# Patient Record
Sex: Female | Born: 1980 | Race: White | Hispanic: No | Marital: Single | Smoking: Former smoker
Health system: Southern US, Community
[De-identification: ages and names within clinical notes are randomized; demographics above are authoritative.]

## PROBLEM LIST (undated history)

## (undated) DIAGNOSIS — D649 Anemia, unspecified: Secondary | ICD-10-CM

## (undated) DIAGNOSIS — E785 Hyperlipidemia, unspecified: Secondary | ICD-10-CM

## (undated) DIAGNOSIS — R790 Abnormal level of blood mineral: Secondary | ICD-10-CM

## (undated) DIAGNOSIS — A6920 Lyme disease, unspecified: Secondary | ICD-10-CM

## (undated) DIAGNOSIS — E039 Hypothyroidism, unspecified: Secondary | ICD-10-CM

## (undated) DIAGNOSIS — R079 Chest pain, unspecified: Secondary | ICD-10-CM

## (undated) HISTORY — DX: Abnormal level of blood mineral: R79.0

## (undated) HISTORY — DX: Hyperlipidemia, unspecified: E78.5

## (undated) HISTORY — PX: HERNIA REPAIR: SHX51

## (undated) HISTORY — PX: TUBAL LIGATION: SHX77

## (undated) HISTORY — DX: Chest pain, unspecified: R07.9

## (undated) HISTORY — PX: BUNIONECTOMY: SHX129

## (undated) HISTORY — DX: Anemia, unspecified: D64.9

---

## 2012-05-06 HISTORY — PX: AUGMENTATION MAMMAPLASTY: SUR837

## 2015-09-05 ENCOUNTER — Emergency Department (HOSPITAL_COMMUNITY): Payer: No Typology Code available for payment source

## 2015-09-05 ENCOUNTER — Emergency Department (HOSPITAL_COMMUNITY)
Admission: EM | Admit: 2015-09-05 | Discharge: 2015-09-05 | Disposition: A | Discharge status: Home / Self Care | Payer: No Typology Code available for payment source | Attending: Emergency Medicine | Admitting: Emergency Medicine

## 2015-09-05 ENCOUNTER — Encounter (HOSPITAL_COMMUNITY): Payer: Self-pay | Admitting: *Deleted

## 2015-09-05 DIAGNOSIS — Y998 Other external cause status: Secondary | ICD-10-CM | POA: Diagnosis not present

## 2015-09-05 DIAGNOSIS — S20212A Contusion of left front wall of thorax, initial encounter: Secondary | ICD-10-CM

## 2015-09-05 DIAGNOSIS — Z8619 Personal history of other infectious and parasitic diseases: Secondary | ICD-10-CM | POA: Insufficient documentation

## 2015-09-05 DIAGNOSIS — Y9389 Activity, other specified: Secondary | ICD-10-CM | POA: Insufficient documentation

## 2015-09-05 DIAGNOSIS — S60511A Abrasion of right hand, initial encounter: Secondary | ICD-10-CM | POA: Diagnosis not present

## 2015-09-05 DIAGNOSIS — Z8639 Personal history of other endocrine, nutritional and metabolic disease: Secondary | ICD-10-CM | POA: Insufficient documentation

## 2015-09-05 DIAGNOSIS — Z3202 Encounter for pregnancy test, result negative: Secondary | ICD-10-CM | POA: Insufficient documentation

## 2015-09-05 DIAGNOSIS — M62830 Muscle spasm of back: Secondary | ICD-10-CM | POA: Diagnosis not present

## 2015-09-05 DIAGNOSIS — S199XXA Unspecified injury of neck, initial encounter: Secondary | ICD-10-CM | POA: Diagnosis present

## 2015-09-05 DIAGNOSIS — S139XXA Sprain of joints and ligaments of unspecified parts of neck, initial encounter: Secondary | ICD-10-CM | POA: Insufficient documentation

## 2015-09-05 DIAGNOSIS — S3992XA Unspecified injury of lower back, initial encounter: Secondary | ICD-10-CM | POA: Diagnosis not present

## 2015-09-05 DIAGNOSIS — Y92410 Unspecified street and highway as the place of occurrence of the external cause: Secondary | ICD-10-CM | POA: Insufficient documentation

## 2015-09-05 HISTORY — DX: Hypothyroidism, unspecified: E03.9

## 2015-09-05 HISTORY — DX: Lyme disease, unspecified: A69.20

## 2015-09-05 LAB — I-STAT BETA HCG BLOOD, ED (MC, WL, AP ONLY): I-stat hCG, quantitative: <5 m[IU]/mL (ref <5)

## 2015-09-05 LAB — CBC WITH DIFFERENTIAL/PLATELET
Basophils Absolute: 0 10*3/uL (ref 0.0–0.1)
Basophils Relative: 1 %
EOS ABS: 0 10*3/uL (ref 0.0–0.7)
EOS PCT: 0 %
HCT: 33.2 % — ABNORMAL LOW (ref 36.0–46.0)
Hemoglobin: 10.3 g/dL — ABNORMAL LOW (ref 12.0–15.0)
Lymphocytes Relative: 18 %
Lymphs Abs: 1.3 10*3/uL (ref 0.7–4.0)
MCH: 26.5 pg (ref 26.0–34.0)
MCHC: 31 g/dL (ref 30.0–36.0)
MCV: 85.6 fL (ref 78.0–100.0)
MONOS PCT: 6 %
Monocytes Absolute: 0.4 10*3/uL (ref 0.1–1.0)
Neutro Abs: 5.4 10*3/uL (ref 1.7–7.7)
Neutrophils Relative %: 75 %
PLATELETS: 323 10*3/uL (ref 150–400)
RBC: 3.88 MIL/uL (ref 3.87–5.11)
RDW: 14.4 % (ref 11.5–15.5)
WBC: 7.2 10*3/uL (ref 4.0–10.5)

## 2015-09-05 LAB — COMPREHENSIVE METABOLIC PANEL
ALT: 20 U/L (ref 14–54)
AST: 21 U/L (ref 15–41)
Albumin: 3.8 g/dL (ref 3.5–5.0)
Alkaline Phosphatase: 27 U/L — ABNORMAL LOW (ref 38–126)
Anion gap: 9 (ref 5–15)
BILIRUBIN TOTAL: 0.5 mg/dL (ref 0.3–1.2)
BUN: 17 mg/dL (ref 6–20)
CHLORIDE: 105 mmol/L (ref 101–111)
CO2: 23 mmol/L (ref 22–32)
CREATININE: 0.68 mg/dL (ref 0.44–1.00)
Calcium: 8.9 mg/dL (ref 8.9–10.3)
GFR calc Af Amer: >60 mL/min (ref >60)
GFR calc non Af Amer: >60 mL/min (ref >60)
GLUCOSE: 90 mg/dL (ref 65–99)
POTASSIUM: 4.5 mmol/L (ref 3.5–5.1)
SODIUM: 137 mmol/L (ref 135–145)
TOTAL PROTEIN: 6.4 g/dL — AB (ref 6.5–8.1)

## 2015-09-05 MED ORDER — IOPAMIDOL (ISOVUE-300) INJECTION 61%
INTRAVENOUS | Status: AC
  Administered 2015-09-05: 75 mL
  Filled 2015-09-05: qty 75

## 2015-09-05 MED ORDER — SODIUM CHLORIDE 0.9 % IV BOLUS (SEPSIS)
500.0000 mL | Freq: Once | INTRAVENOUS | Status: AC
  Administered 2015-09-05: 500 mL via INTRAVENOUS

## 2015-09-05 MED ORDER — IBUPROFEN 400 MG PO TABS
600.0000 mg | ORAL_TABLET | Freq: Once | ORAL | Status: AC
  Administered 2015-09-05: 600 mg via ORAL
  Filled 2015-09-05: qty 1
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
## 2015-09-05 NOTE — Progress Notes (Signed)
Orthopedic Tech Progress Note Patient Details:  Brittney Saunders 06/25/1980 [id number]  Ortho Devices Type of Ortho Device: Thumb velcro splint Ortho Device/Splint Interventions: Application   Saul FordyceJennifer C Muaaz Brau 09/05/2015, 2:20 PM

## 2015-09-08 ENCOUNTER — Emergency Department
Admission: EM | Admit: 2015-09-08 | Discharge: 2015-09-08 | Disposition: A | Discharge status: Home / Self Care | Payer: No Typology Code available for payment source | Attending: Emergency Medicine | Admitting: Emergency Medicine

## 2015-09-08 ENCOUNTER — Encounter: Payer: Self-pay | Admitting: Emergency Medicine

## 2015-09-08 ENCOUNTER — Emergency Department: Payer: No Typology Code available for payment source

## 2015-09-08 DIAGNOSIS — S299XXA Unspecified injury of thorax, initial encounter: Secondary | ICD-10-CM | POA: Diagnosis present

## 2015-09-08 DIAGNOSIS — G44319 Acute post-traumatic headache, not intractable: Secondary | ICD-10-CM | POA: Insufficient documentation

## 2015-09-08 DIAGNOSIS — Y9389 Activity, other specified: Secondary | ICD-10-CM | POA: Insufficient documentation

## 2015-09-08 DIAGNOSIS — E039 Hypothyroidism, unspecified: Secondary | ICD-10-CM | POA: Diagnosis not present

## 2015-09-08 DIAGNOSIS — Y92410 Unspecified street and highway as the place of occurrence of the external cause: Secondary | ICD-10-CM | POA: Insufficient documentation

## 2015-09-08 DIAGNOSIS — S20212A Contusion of left front wall of thorax, initial encounter: Secondary | ICD-10-CM

## 2015-09-08 DIAGNOSIS — Y999 Unspecified external cause status: Secondary | ICD-10-CM | POA: Diagnosis not present

## 2015-09-08 DIAGNOSIS — S2020XA Contusion of thorax, unspecified, initial encounter: Secondary | ICD-10-CM | POA: Insufficient documentation

## 2015-09-08 MED ORDER — METHOCARBAMOL 500 MG PO TABS: 500.0000 mg | ORAL_TABLET | Freq: Four times a day (QID) | ORAL | Status: DC

## 2015-09-08 MED ORDER — MELOXICAM 15 MG PO TABS: 15.0000 mg | ORAL_TABLET | Freq: Every day | ORAL | Status: DC

## 2015-09-08 MED ORDER — KETOROLAC TROMETHAMINE 30 MG/ML IJ SOLN
30.0000 mg | Freq: Once | INTRAMUSCULAR | Status: AC
  Administered 2015-09-08: 30 mg via INTRAMUSCULAR
  Filled 2015-09-08: qty 1
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
## 2015-09-08 NOTE — ED Notes (Signed)
AAOx3.  Skin warm and dry.  Moving all extremities equally and strong.  Ambulates with easy and steady gait.  Posture upright and relaxed.  NAD 
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
## 2015-09-08 NOTE — ED Provider Notes (Signed)
EKG: Interpreted by me, Sinus bradycardia with a rate of 49 bpm, low voltage, normal PR interval, normal QRS, normal QT interval. Normal axis.  Medical screening examination/treatment/procedure(s) were performed by non-physician practitioner and as supervising physician I was immediately available for consultation/collaboration.    Emily FilbertJonathan E Gao Mitnick, MD 09/08/15 [phone]
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
## 2016-02-16 ENCOUNTER — Emergency Department (HOSPITAL_COMMUNITY)
Admission: EM | Admit: 2016-02-16 | Discharge: 2016-02-16 | Disposition: A | Discharge status: Home / Self Care | Payer: Medicaid Other | Attending: Emergency Medicine | Admitting: Emergency Medicine

## 2016-02-16 ENCOUNTER — Emergency Department (HOSPITAL_COMMUNITY): Payer: Medicaid Other

## 2016-02-16 ENCOUNTER — Encounter (HOSPITAL_COMMUNITY): Payer: Self-pay

## 2016-02-16 DIAGNOSIS — R079 Chest pain, unspecified: Secondary | ICD-10-CM | POA: Diagnosis not present

## 2016-02-16 DIAGNOSIS — Z5321 Procedure and treatment not carried out due to patient leaving prior to being seen by health care provider: Secondary | ICD-10-CM | POA: Diagnosis not present

## 2016-02-16 LAB — BASIC METABOLIC PANEL
Anion gap: 6 (ref 5–15)
BUN: 8 mg/dL (ref 6–20)
CALCIUM: 9.3 mg/dL (ref 8.9–10.3)
CO2: 28 mmol/L (ref 22–32)
CREATININE: 0.71 mg/dL (ref 0.44–1.00)
Chloride: 105 mmol/L (ref 101–111)
GFR calc Af Amer: >60 mL/min (ref >60)
GFR calc non Af Amer: >60 mL/min (ref >60)
GLUCOSE: 92 mg/dL (ref 65–99)
Potassium: 4.1 mmol/L (ref 3.5–5.1)
Sodium: 139 mmol/L (ref 135–145)

## 2016-02-16 LAB — I-STAT TROPONIN, ED: TROPONIN I, POC: 0 ng/mL (ref 0.00–0.08)

## 2016-02-16 LAB — CBC
HCT: 39.4 % (ref 36.0–46.0)
Hemoglobin: 12.5 g/dL (ref 12.0–15.0)
MCH: 27 pg (ref 26.0–34.0)
MCHC: 31.7 g/dL (ref 30.0–36.0)
MCV: 85.1 fL (ref 78.0–100.0)
PLATELETS: 305 10*3/uL (ref 150–400)
RBC: 4.63 MIL/uL (ref 3.87–5.11)
RDW: 15.9 % — ABNORMAL HIGH (ref 11.5–15.5)
WBC: 4.7 10*3/uL (ref 4.0–10.5)

## 2016-02-16 NOTE — ED Triage Notes (Signed)
Pt. Having chest pain for 3 months.  [facility]ent to [facility] yesterday and they did EKG, CT CHEST,  Echocardiogram labs and discharged to home.  EVerything was negative.  Pt. Continues to have pain.  Pt. Was sent home without any pain medications and was not referred .  Pt. Wants to know what is causing the pain.  Skin is warm and dry, pink.  ECG completed in triage.

## 2016-02-16 NOTE — ED Notes (Signed)
Pt lwbs 

## 2016-02-22 ENCOUNTER — Telehealth: Payer: Self-pay | Admitting: Cardiovascular Disease

## 2016-02-22 NOTE — Telephone Encounter (Signed)
Received records from [facility] with Dr Tresa EndoKelly on 02/23/16.  [facility] for Dr Landry DykeKelly's schedule on 02/23/16. lp

## 2016-02-23 ENCOUNTER — Ambulatory Visit (INDEPENDENT_AMBULATORY_CARE_PROVIDER_SITE_OTHER): Payer: Medicaid Other | Admitting: Cardiovascular Disease

## 2016-02-23 ENCOUNTER — Encounter: Payer: Self-pay | Admitting: Cardiovascular Disease

## 2016-02-23 VITALS — BP 100/64 | HR 68 | Ht 62.0 in | Wt 139.2 lb

## 2016-02-23 DIAGNOSIS — Z9882 Breast implant status: Secondary | ICD-10-CM | POA: Diagnosis not present

## 2016-02-23 DIAGNOSIS — R0789 Other chest pain: Secondary | ICD-10-CM

## 2016-02-23 DIAGNOSIS — Z79899 Other long term (current) drug therapy: Secondary | ICD-10-CM | POA: Diagnosis not present

## 2016-02-23 DIAGNOSIS — Z8619 Personal history of other infectious and parasitic diseases: Secondary | ICD-10-CM

## 2016-02-23 DIAGNOSIS — Z13220 Encounter for screening for lipoid disorders: Secondary | ICD-10-CM

## 2016-02-23 DIAGNOSIS — Z8719 Personal history of other diseases of the digestive system: Secondary | ICD-10-CM

## 2016-02-23 LAB — COMPREHENSIVE METABOLIC PANEL
ALT: 14 U/L (ref 6–29)
AST: 21 U/L (ref 10–30)
Albumin: 4.6 g/dL (ref 3.6–5.1)
Alkaline Phosphatase: 35 U/L (ref 33–115)
BUN: 10 mg/dL (ref 7–25)
CHLORIDE: 101 mmol/L (ref 98–110)
CO2: 28 mmol/L (ref 20–31)
Calcium: 9.6 mg/dL (ref 8.6–10.2)
Creat: 0.77 mg/dL (ref 0.50–1.10)
Glucose, Bld: 73 mg/dL (ref 65–99)
POTASSIUM: 4.9 mmol/L (ref 3.5–5.3)
Sodium: 140 mmol/L (ref 135–146)
TOTAL PROTEIN: 7.7 g/dL (ref 6.1–8.1)
Total Bilirubin: 0.7 mg/dL (ref 0.2–1.2)

## 2016-02-23 LAB — CBC WITH DIFFERENTIAL/PLATELET
BASOS ABS: 0 {cells}/uL (ref 0–200)
Basophils Relative: 0 %
Eosinophils Absolute: 70 cells/uL (ref 15–500)
Eosinophils Relative: 1 %
HEMATOCRIT: 41.1 % (ref 35.0–45.0)
HEMOGLOBIN: 13.4 g/dL (ref 11.7–15.5)
LYMPHS ABS: 1890 {cells}/uL (ref 850–3900)
Lymphocytes Relative: 27 %
MCH: 27.3 pg (ref 27.0–33.0)
MCHC: 32.6 g/dL (ref 32.0–36.0)
MCV: 83.7 fL (ref 80.0–100.0)
MONO ABS: 490 {cells}/uL (ref 200–950)
MPV: 10 fL (ref 7.5–12.5)
Monocytes Relative: 7 %
NEUTROS PCT: 65 %
Neutro Abs: 4550 cells/uL (ref 1500–7800)
Platelets: 336 10*3/uL (ref 140–400)
RBC: 4.91 MIL/uL (ref 3.80–5.10)
RDW: 16.1 % — ABNORMAL HIGH (ref 11.0–15.0)
WBC: 7 10*3/uL (ref 3.8–10.8)

## 2016-02-23 LAB — LIPID PANEL
CHOL/HDL RATIO: 3.5 ratio (ref <=5.0)
CHOLESTEROL: 305 mg/dL — AB (ref 125–200)
HDL: 87 mg/dL (ref >=46)
LDL Cholesterol: 197 mg/dL — ABNORMAL HIGH (ref <130)
Triglycerides: 107 mg/dL (ref <150)
VLDL: 21 mg/dL (ref <30)

## 2016-02-23 LAB — TSH: TSH: 0.6 mIU/L

## 2016-02-23 LAB — C-REACTIVE PROTEIN: CRP: 0.7 mg/L (ref <8.0)

## 2016-02-23 NOTE — Patient Instructions (Addendum)
Your physician recommends that you return for lab work FASTING..  Your physician has requested that you have an echocardiogram. Echocardiography is a painless test that uses sound waves to create images of your heart. It provides your doctor with information about the size and shape of your heart and how well your heart's chambers and valves are working. This procedure takes approximately one hour. There are no restrictions for this procedure. [facility].  Your physician has requested that you have an exercise tolerance test. For further information please visit [email or web address]. Please also follow instruction sheet, as given. This [facility].   Your physician recommends that you schedule a follow-up appointment in: after tests

## 2016-02-24 NOTE — Progress Notes (Signed)
Cardiology Office Note    Date:  02/24/2016   ID:  Brittney Saunders, DOB 1980/07/18, MRN [id number]  PCP:  No PCP Per Patient  Cardiologist:  Shelva Majestic, MD   Chief Complaint  Patient presents with  . New Patient (Initial Visit)    has had some chest pain and tightnessX 3 months, occassional shortness of breath, has edema in hands and feet, no pain or cramping in legs, occassional lightheaded, no dizziness    History of Present Illness:  Brittney Saunders is a 35 y.o. female who is self-referred for cardiology evaluation.  The patient admits to at least 3 month history of chest pain.  Chest pain is typically sharp, pleuritic in nature, and last seconds to minutes, and is not exertionally precipitated.  Apparently, she has had several emergency room evaluations.  Most recently, she went to [facility] on 02/16/2016.  Reportedly the ECG was normal.  She underwent a CT of her chest which reportedly was normal.  In the emergency room reportedly a brief portable echo did not reveal any pericardial effusion and she was discharged home.  Patient is in the office today with her mother.  She is originally from [place].  Approximate 12 years ago.  She was treated with Lyme disease.  She states she received 3 months of antibiotics and it took a year for her Lyme disease to be detected raising the possibility of advanced Lyme disease.  On presentation with possible logic complications.  Review of records from [facility] reveals that she underwent HIV screening and screening for sexually transmitted diseases earlier this year.  This was done after she had changed partners.  According to the patient's mother, previously the patient had been very active and exercised regularly.  Over the last several months.  She cannot do anything without developing this chest pain.  She also has had issues with several abscesses.  The patient states that she cannot sleep well because of pain.  I'm  she takes Xanax to help her sleep.  Of note, 5 years ago for 30th birthday she had silicone breast implants.  Her chest pain occurs every day.  It is not improved with Tylenol.  She denies any fevers, chills or night sweats.  She denies any recent rashes.  She presents for cardiology evaluation.   Past Medical History:  Diagnosis Date  . Hypothyroidism   . Lyme disease     Past Surgical History:  Procedure Laterality Date  . CESAREAN SECTION  2005, 2006, 2008    Current Medications: Outpatient Medications Prior to Visit  Medication Sig Dispense Refill  . levothyroxine (SYNTHROID, LEVOTHROID) 50 MCG tablet Take 50 mcg by mouth daily.  0  . meloxicam (MOBIC) 15 MG tablet Take 1 tablet (15 mg total) by mouth daily. (Patient not taking: Reported on 02/23/2016) 30 tablet 0  . methocarbamol (ROBAXIN) 500 MG tablet Take 1 tablet (500 mg total) by mouth 4 (four) times daily. (Patient not taking: Reported on 02/23/2016) 16 tablet 0   No facility-administered medications prior to visit.      Allergies:   Review of patient's allergies indicates no known allergies.   Social History   Social History  . Marital status: Single    Spouse name: N/A  . Number of children: N/A  . Years of education: N/A   Social History Main Topics  . Smoking status: Never Smoker  . Smokeless tobacco: Never Used  . Alcohol use Yes     Comment:  occassional  . Drug use: No  . Sexual activity: Not Asked   Other Topics Concern  . None   Social History Narrative  . None    Initial social history is notable in that she is divorced for 10 years.  She has 3 children.  She never smoked.  Occasional beer and wine.  He previously used to exercise and do cross fit.  He is not exercise over the past 3-4 months due to her constant pain.  Family History:  The patient's family history includes Aneurysm in her maternal grandmother; Congestive Heart Failure in her maternal grandfather; Heart attack (age of onset: 69) in  her maternal uncle; Other in her maternal aunt.  Her mother is alive at 46 and her father is 71.  She has 2 sisters and one brother do not have significant illness.  ROS General: Negative; No fevers, chills, or night sweats; has it if her fatigue HEENT: Positive for several recent dental abscesses; No changes in vision or hearing, sinus congestion, difficulty swallowing Pulmonary: Negative; No cough, wheezing, shortness of breath, hemoptysis Cardiovascular: See history of present illness GI: Negative; No nausea, vomiting, diarrhea, or abdominal pain GU: Negative; No dysuria, hematuria, or difficulty voiding Musculoskeletal: Negative; no myalgias, joint pain, or weakness Hematologic/Oncology: Negative; no easy bruising, bleeding Endocrine: Negative; no heat/cold intolerance; no diabetes Neuro: Negative; no changes in balance, headaches Skin: Negative; No rashes or skin lesions Psychiatric: She denies being depressed Sleep: Positive for daytime sleepiness.  She states she is sleepy because she cannot sleep at night.  She denies snoring.  She denies any episodes of cataplexy or awareness of narcolepsy. Other comprehensive 14 point system review is negative.  I calculated an Epworth sleepiness scale score today and this endorsed at 24 and is consistent with significant excessive daytime sleepiness   PHYSICAL EXAM:   VS:  BP 100/64 (BP Location: Left Arm, Patient Position: Sitting, Cuff Size: Normal)   Pulse 68   Ht 5' 2"  (1.575 m)   Wt 139 lb 4 oz (63.2 kg)   LMP 02/04/2016   BMI 25.47 kg/m      Wt Readings from Last 3 Encounters:  02/23/16 139 lb 4 oz (63.2 kg)  02/16/16 140 lb (63.5 kg)  09/08/15 140 lb (63.5 kg)    General: Alert, oriented, no distress. Huntched appearance. Skin: normal turgor, no rashes, warm and dry; multiple tattoos HEENT: Normocephalic, atraumatic. Pupils equal round and reactive to light; sclera anicteric; extraocular muscles intact; Fundi Without  hemorrhages or exudates Nose without nasal septal hypertrophy; she has a nose ring Mouth/Parynx benign; Mallinpatti scale Neck: No JVD, no carotid bruits; normal carotid upstroke Lungs: clear to ausculatation and percussion; no wheezing or rales Chest wall: without tenderness to palpitation; bilateral breast implants Heart: PMI not displaced, RRR, s1 s2 normal, 1/6 systolic murmur, no diastolic murmur, no rubs, gallops, thrills, or heaves;  Abdomen: soft, nontender; no hepatosplenomehaly, BS+; abdominal aorta nontender and not dilated by palpation.- Back: no CVA tenderness Pulses 2+ Musculoskeletal: full range of motion, normal strength, no joint deformities Extremities: no clubbing cyanosis or edema, Homan's sign negative  Neurologic: grossly nonfocal; Cranial nerves grossly wnl Psychologic: Flat affect   Studies/Labs Reviewed:   ECG (independently read by me): Normal sinus rhythm at 69 bpm.  Normal intervals.  No ST segment changes.  No ectopy.  Recent Labs: BMP Latest Ref Rng & Units 02/16/2016 09/05/2015  Glucose 65 - 99 mg/dL 92 90  BUN 6 - 20 mg/dL 8 17  Creatinine 0.44 - 1.00 mg/dL 0.71 0.68  Sodium 135 - 145 mmol/L 139 137  Potassium 3.5 - 5.1 mmol/L 4.1 4.5  Chloride 101 - 111 mmol/L 105 105  CO2 22 - 32 mmol/L 28 23  Calcium 8.9 - 10.3 mg/dL 9.3 8.9     Hepatic Function Latest Ref Rng & Units 09/05/2015  Total Protein 6.5 - 8.1 g/dL 6.4(L)  Albumin 3.5 - 5.0 g/dL 3.8  AST 15 - 41 U/L 21  ALT 14 - 54 U/L 20  Alk Phosphatase 38 - 126 U/L 27(L)  Total Bilirubin 0.3 - 1.2 mg/dL 0.5    CBC Latest Ref Rng & Units 02/16/2016 09/05/2015  WBC 4.0 - 10.5 K/uL 4.7 7.2  Hemoglobin 12.0 - 15.0 g/dL 12.5 10.3(L)  Hematocrit 36.0 - 46.0 % 39.4 33.2(L)  Platelets 150 - 400 K/uL 305 323   Lab Results  Component Value Date   MCV 85.1 02/16/2016   MCV 85.6 09/05/2015   No results found for: TSH No results found for: HGBA1C   BNP No results found for: BNP  ProBNP No  results found for: PROBNP   Lipid Panel  No results found for: CHOL, TRIG, HDL, CHOLHDL, VLDL, LDLCALC, LDLDIRECT   RADIOLOGY: Dg Chest 2 View  Result Date: 02/16/2016 CLINICAL DATA:  Midchest pain worsening for 3 months. EXAM: CHEST  2 VIEW COMPARISON:  Chest radiograph 09/08/2015 FINDINGS: Cardiomediastinal contours are normal. No pneumothorax or pleural effusion. No focal airspace consolidation or pulmonary edema. IMPRESSION: Clear lungs. Electronically Signed   By: Ulyses Jarred M.D.   On: 02/16/2016 13:42     Additional studies/ records that were reviewed today include:  I have reviewed ER nurse report from her [facility] emergency room evaluation.  I also have reviewed the encounter at [facility] by Aldona Bar    ASSESSMENT:    1. Other chest pain   2. Atypical chest pain   3. Drug therapy   4. S/P bilateral breast implants   5. Screening for lipid disorders   6. History of Lyme disease   7. History of abscessed tooth      PLAN:  Ms. Jamiaya Bina is a 35 year old female who presents with a three-month history of almost constant, sharp, pleuritic-like chest discomfort.  She has been evaluated in the emergency rooms.  Reportedly, a CT scan was normal.  I will try to get the actual formal interpretation She denies any exertional precipitation of chest pain.  She denies any fevers, chills or night sweats.  There is a history of  remoteLyme disease, but she states she was treated with IV antibiotics for up to 3 months 12 years ago.  This suggests that she had advanced Lyme disease and I am not certain if she developed any neurologic or cardiovascular complications.  I will check with infectious disease to see if there is any value in obtaining blood work concerning titers for Lyme disease antibodies or other serologies.  She is 5 year status post insertion of silicone breast implants; she is unaware of any documentation of silicone leak.  She does not have  chest wall tenderness on exam.  I have recommended that she undergo complete formal 2-D echo Doppler study for further evaluation of potential structural heart disease.  I am also recommending complete set of blood work including a CMET, CBC with differential, ESR, ANA, C-reactive protein, TSH, and lipid panel.  I do not feel her chest pain is ischemic.  However, she is afraid  to do any activity.  I am recommending a routine treadmill test, which hopefully will be normal and can provide encouragement for resumption of exercise.  She may have significant inflammation contributed by recurrent abscessed teeth.  Clinically, she appears depressed.  I will see her back in the office in follow-up of the above studies and further recommendations will be made at that time.   Medication Adjustments/Labs and Tests Ordered: Current medicines are reviewed at length with the patient today.  Concerns regarding medicines are outlined above.  Medication changes, Labs and Tests ordered today are listed in the Patient Instructions below.  Patient Instructions  Your physician recommends that you return for lab work FASTING..  Your physician has requested that you have an echocardiogram. Echocardiography is a painless test that uses sound waves to create images of your heart. It provides your doctor with information about the size and shape of your heart and how well your heart's chambers and valves are working. This procedure takes approximately one hour. There are no restrictions for this procedure. This will be done at the [facility].  Your physician has requested that you have an exercise tolerance test. For further information please visit [email or web address]. Please also follow instruction sheet, as given. This will be done here at the [place].   Your physician recommends that you schedule a follow-up appointment in: after tests        Signed, Shelva Majestic, MD  02/24/2016 12:50 PM      [facility] [address] [place], [place], [place]  [address] Phone: [phone]

## 2016-02-26 LAB — ANA: Anti Nuclear Antibody(ANA): NEGATIVE

## 2016-02-27 ENCOUNTER — Encounter (HOSPITAL_COMMUNITY): Payer: Self-pay | Admitting: Emergency Medicine

## 2016-02-27 ENCOUNTER — Ambulatory Visit (INDEPENDENT_AMBULATORY_CARE_PROVIDER_SITE_OTHER): Payer: Medicaid Other

## 2016-02-27 ENCOUNTER — Telehealth (HOSPITAL_COMMUNITY): Payer: Self-pay

## 2016-02-27 ENCOUNTER — Ambulatory Visit (HOSPITAL_COMMUNITY)
Admission: EM | Admit: 2016-02-27 | Discharge: 2016-02-27 | Disposition: A | Discharge status: Home / Self Care | Payer: Medicaid Other | Attending: Family Medicine | Admitting: Family Medicine

## 2016-02-27 DIAGNOSIS — M25512 Pain in left shoulder: Secondary | ICD-10-CM | POA: Diagnosis not present

## 2016-02-27 DIAGNOSIS — S2232XA Fracture of one rib, left side, initial encounter for closed fracture: Secondary | ICD-10-CM | POA: Diagnosis not present

## 2016-02-27 MED ORDER — HYDROCODONE-ACETAMINOPHEN 7.5-325 MG PO TABS: 1.0000 | ORAL_TABLET | Freq: Four times a day (QID) | ORAL | 0 refills | Status: DC | PRN
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
## 2016-02-27 NOTE — Telephone Encounter (Signed)
Encounter complete. 

## 2016-02-27 NOTE — ED Triage Notes (Signed)
[facility] a complaint of left shoulder, arm and neck pain secondary to falling down 6 stairs yesterday.

## 2016-02-28 ENCOUNTER — Telehealth (HOSPITAL_COMMUNITY): Payer: Self-pay

## 2016-02-28 ENCOUNTER — Ambulatory Visit (HOSPITAL_COMMUNITY)
Admission: RE | Admit: 2016-02-28 | Discharge: 2016-02-28 | Disposition: A | Discharge status: Home / Self Care | Payer: Medicaid Other | Source: Ambulatory Visit | Attending: Cardiovascular Disease | Admitting: Cardiovascular Disease

## 2016-02-28 DIAGNOSIS — R0789 Other chest pain: Secondary | ICD-10-CM | POA: Diagnosis not present

## 2016-02-28 NOTE — Progress Notes (Signed)
  Echocardiogram 2D Echocardiogram has been performed.  Arvil ChacoFoster, Amadu Schlageter 02/28/2016, 10:25 AM

## 2016-02-28 NOTE — Telephone Encounter (Signed)
Encounter complete. 

## 2016-02-29 ENCOUNTER — Ambulatory Visit (HOSPITAL_COMMUNITY)
Admission: RE | Admit: 2016-02-29 | Discharge: 2016-02-29 | Disposition: A | Discharge status: Home / Self Care | Payer: Medicaid Other | Source: Ambulatory Visit | Attending: Cardiovascular Disease | Admitting: Cardiovascular Disease

## 2016-02-29 DIAGNOSIS — R0789 Other chest pain: Secondary | ICD-10-CM

## 2016-02-29 LAB — EXERCISE TOLERANCE TEST
CHL CUP MPHR: 186 {beats}/min
CSEPED: 15 min
CSEPEDS: 1 s
CSEPHR: 102 %
CSEPPHR: 190 {beats}/min
Estimated workload: 17.2 METS
RPE: 17
Rest HR: 80 {beats}/min

## 2016-03-04 ENCOUNTER — Telehealth: Payer: Self-pay | Admitting: Cardiovascular Disease

## 2016-03-04 DIAGNOSIS — Z79899 Other long term (current) drug therapy: Secondary | ICD-10-CM

## 2016-03-04 DIAGNOSIS — E785 Hyperlipidemia, unspecified: Secondary | ICD-10-CM

## 2016-03-04 NOTE — Telephone Encounter (Signed)
Goes directly to VM. Left msg to call. 

## 2016-03-04 NOTE — Telephone Encounter (Signed)
Pt is returning your call

## 2016-03-04 NOTE — Telephone Encounter (Signed)
Returned call to patient. Let her know that Dr Tresa EndoKelly has not reviewed the studies yet. Gave her a preliminary run down and that Dr Tresa EndoKelly will need to review for further information. Will route to Dr Tresa EndoKelly.

## 2016-03-04 NOTE — Telephone Encounter (Signed)
Pt would like her stress and echo test results from last week please.

## 2016-03-05 ENCOUNTER — Ambulatory Visit (HOSPITAL_COMMUNITY)
Admission: EM | Admit: 2016-03-05 | Discharge: 2016-03-05 | Disposition: A | Discharge status: Home / Self Care | Payer: Medicaid Other | Attending: Emergency Medicine | Admitting: Emergency Medicine

## 2016-03-05 ENCOUNTER — Encounter (HOSPITAL_COMMUNITY): Payer: Self-pay | Admitting: Emergency Medicine

## 2016-03-05 ENCOUNTER — Ambulatory Visit (INDEPENDENT_AMBULATORY_CARE_PROVIDER_SITE_OTHER): Payer: Medicaid Other

## 2016-03-05 DIAGNOSIS — S2232XD Fracture of one rib, left side, subsequent encounter for fracture with routine healing: Secondary | ICD-10-CM

## 2016-03-05 DIAGNOSIS — F4323 Adjustment disorder with mixed anxiety and depressed mood: Secondary | ICD-10-CM

## 2016-03-05 DIAGNOSIS — T7491XA Unspecified adult maltreatment, confirmed, initial encounter: Secondary | ICD-10-CM | POA: Diagnosis not present

## 2016-03-05 DIAGNOSIS — M79641 Pain in right hand: Secondary | ICD-10-CM

## 2016-03-05 MED ORDER — TRAMADOL HCL 50 MG PO TABS: 50.0000 mg | ORAL_TABLET | Freq: Four times a day (QID) | ORAL | 0 refills | Status: DC | PRN

## 2016-03-05 MED ORDER — CYCLOBENZAPRINE HCL 5 MG PO TABS: 5.0000 mg | ORAL_TABLET | Freq: Three times a day (TID) | ORAL | 0 refills | Status: DC | PRN

## 2016-03-05 MED ORDER — CITALOPRAM HYDROBROMIDE 10 MG PO TABS: 10.0000 mg | ORAL_TABLET | Freq: Every day | ORAL | 0 refills | Status: DC

## 2016-03-05 MED ORDER — ATORVASTATIN CALCIUM 40 MG PO TABS: 40.0000 mg | ORAL_TABLET | Freq: Every day | ORAL | 3 refills | Status: DC
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
## 2016-03-05 NOTE — Telephone Encounter (Signed)
Called results of echo, GXT and labwork to pt. She verbalized understanding. Rx for atorvastatin 40mg  (1 tablet) by mouth once a day. Lab work orders entered and mailed to patient. She is aware to be fasting and have the labs in 8 weeks (on or around Decemner 27, 2017.)

## 2016-03-05 NOTE — Telephone Encounter (Signed)
[facility]  Labs good x lipids with TC > 300; start atorvastatin 40 mg; Echo and GXT are normal.

## 2016-03-07 ENCOUNTER — Telehealth: Payer: Self-pay | Admitting: *Deleted

## 2016-03-07 ENCOUNTER — Telehealth: Payer: Self-pay | Admitting: Cardiovascular Disease

## 2016-03-07 NOTE — Telephone Encounter (Signed)
Left message to return a call to discuss results and recommendations. 

## 2016-03-07 NOTE — Telephone Encounter (Signed)
-----   Message from Lennette Biharihomas A Kelly, MD sent at 03/05/2016  9:08 AM EDT ----- Labs good x significantly elevated lipids;  Start atorvastatin 40 mg; re-check lipids and LFTs in 8 weeks

## 2016-03-07 NOTE — Telephone Encounter (Signed)
New message  [facility]  Please call back

## 2016-03-08 NOTE — Telephone Encounter (Signed)
[facility]

## 2016-03-13 ENCOUNTER — Telehealth: Payer: Self-pay | Admitting: *Deleted

## 2016-03-13 NOTE — Telephone Encounter (Signed)
Patient notified of lab results and recommendations. Patient voiced verbal understanding. Atorvastatin called to patient's pharmacy. Follow up labs ordered. Patient advised to keep follow up appointment.

## 2016-03-13 NOTE — Telephone Encounter (Signed)
-----   Message from Thomas A Kelly, MD sent at 03/05/2016  9:08 AM EDT ----- Labs good x significantly elevated lipids;  Start atorvastatin 40 mg; re-check lipids and LFTs in 8 weeks 

## 2016-03-22 ENCOUNTER — Encounter: Payer: Self-pay | Admitting: Cardiovascular Disease

## 2016-03-22 ENCOUNTER — Ambulatory Visit (INDEPENDENT_AMBULATORY_CARE_PROVIDER_SITE_OTHER): Payer: Medicaid Other | Admitting: Cardiovascular Disease

## 2016-03-22 VITALS — BP 106/72 | HR 78 | Ht 62.0 in | Wt 142.2 lb

## 2016-03-22 DIAGNOSIS — E7800 Pure hypercholesterolemia, unspecified: Secondary | ICD-10-CM | POA: Diagnosis not present

## 2016-03-22 DIAGNOSIS — R0789 Other chest pain: Secondary | ICD-10-CM | POA: Diagnosis not present

## 2016-03-22 DIAGNOSIS — Z79899 Other long term (current) drug therapy: Secondary | ICD-10-CM

## 2016-03-22 NOTE — Patient Instructions (Signed)
Your physician wants you to follow-up in: 6 months or sooner if needed. You will receive a reminder letter in the mail two months in advance. If you don't receive a letter, please call our office to schedule the follow-up appointment.   If you need a refill on your cardiac medications before your next appointment, please call your pharmacy. 

## 2016-03-24 NOTE — Progress Notes (Signed)
Cardiology Office Note    Date:  03/24/2016   ID:  Brittney Saunders, DOB [birth year], MRN [id number]  PCP:  No PCP Per Patient  Cardiologist:  Shelva Majestic, MD   Chief Complaint  Patient presents with  . Follow-up    test results     History of Present Illness:  Brittney Saunders is a 35 y.o. female who is self-referred for cardiology evaluation.  The patient admits to at least 3 month history of chest pain.  Chest pain is typically sharp, pleuritic in nature, and last seconds to minutes, and is not exertionally precipitated.  Apparently, she has had several emergency room evaluations.  Most recently, she went to [facility] on 02/16/2016.  Reportedly the ECG was normal.  She underwent a CT of her chest which reportedly was normal.  In the emergency room reportedly a brief portable echo did not reveal any pericardial effusion and she was discharged home.  Patient is in the office today with her mother.  She is originally from [place].  Approximate 12 years ago.  She was treated with Lyme disease.  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  Review of records from [facility] reveals that she underwent HIV screening and screening for sexually transmitted diseases earlier this year.  This was done after she had changed partners.  According to the patient's mother, previously the patient had been very active and exercised regularly.  Over the last several months.  She cannot do anything without developing this chest pain.  She also has had issues with several abscesses.  The patient states that she cannot sleep well because of pain.  I'm she takes Xanax to help her sleep.  Of note, 5 years ago for 30th birthday she had silicone breast implants.  Her chest pain was occuring every day.  It is not improved with Tylenol.  She denies any fevers,  chills or night sweats.  She denies any recent rashes.    Since I initially saw her, she underwent an echo Doppler study on 02/28/2016.  Ejection fraction was 60-65%.  There was normal wall motion.  She had normal diastolic parameters.  There is trivial aortic insufficiency.  There was no evidence for any vegetation.  She underwent a routine treadmill test which revealed excellent exercise tolerance.  She will exercise for 15 minutes without chest pain or ECG changes.  No score was 15.  He underwent a comprehensive blood work now evaluation, which essentially was  normal with the exception of marked hyperlipidemia.  Her cholesterol was 305, triglycerides 107, HDL 87, and LDL 197.  CBC, chemistry profile, TSH, C-reactive protein, and ANA were all normal.  Once her lipid studies were obtained, we called in a prescription for atorvastatin.  She is now taking this at 40 mg.  She is tolerating this well.  She presents for reevaluation.   Past Medical History:  Diagnosis Date  . Hypothyroidism   . Lyme disease     Past Surgical History:  Procedure Laterality Date  . CESAREAN SECTION  2005, 2006, 2008  . HERNIA REPAIR      Current Medications: Outpatient Medications Prior to Visit  Medication Sig Dispense Refill  . atorvastatin (LIPITOR) 40 MG tablet Take 1 tablet (40 mg total) by mouth daily. 90 tablet 3  . citalopram (CELEXA) 10 MG tablet Take 1 tablet (  10 mg total) by mouth daily. After 2 weeks increase to 2 tablets daily. (Patient taking differently: Take 20 mg by mouth daily. ) 60 tablet 0  . levothyroxine (SYNTHROID, LEVOTHROID) 50 MCG tablet Take 50 mcg by mouth daily.  0  . cyclobenzaprine (FLEXERIL) 5 MG tablet Take 1 tablet (5 mg total) by mouth 3 (three) times daily as needed for muscle spasms. (Patient not taking: Reported on 03/22/2016) 15 tablet 0  . traMADol (ULTRAM) 50 MG tablet Take 1 tablet (50 mg total) by mouth every 6 (six) hours as needed for severe pain. (Patient not taking:  Reported on 03/22/2016) 15 tablet 0   No facility-administered medications prior to visit.      Allergies:   Prednisone   Social History   Social History  . Marital status: Single    Spouse name: N/A  . Number of children: N/A  . Years of education: N/A   Social History Main Topics  . Smoking status: Never Smoker  . Smokeless tobacco: Never Used  . Alcohol use Yes     Comment: occassional  . Drug use: No  . Sexual activity: Not Asked   Other Topics Concern  . None   Social History Narrative  . None    Additional social history is notable in that she is divorced for 10 years.  She has 3 children.  She never smoked.  Occasional beer and wine.  He previously used to exercise and do cross fit.  He is not exercise over the past 3-4 months due to her constant pain.  Family History:  The patient's family history includes Aneurysm in her maternal grandmother; Congestive Heart Failure in her maternal grandfather; Heart attack (age of onset: 53) in her maternal uncle; Other in her maternal aunt.  Her mother is alive at 6 and her father is 60.  She has 2 sisters and one brother do not have significant illness.  ROS General: Negative; No fevers, chills, or night sweats; has it if her fatigue HEENT: Positive for several recent dental abscesses; No changes in vision or hearing, sinus congestion, difficulty swallowing Pulmonary: Negative; No cough, wheezing, shortness of breath, hemoptysis Cardiovascular: See history of present illness GI: Negative; No nausea, vomiting, diarrhea, or abdominal pain GU: Negative; No dysuria, hematuria, or difficulty voiding Musculoskeletal: Negative; no myalgias, joint pain, or weakness Hematologic/Oncology: Negative; no easy bruising, bleeding Endocrine: Negative; no heat/cold intolerance; no diabetes Neuro: Negative; no changes in balance, headaches Skin: Negative; No rashes or skin lesions Psychiatric: She denies being depressed Sleep: Positive for  daytime sleepiness.  She states she is sleepy because she cannot sleep at night.  She denies snoring.  She denies any episodes of cataplexy or awareness of narcolepsy. Other comprehensive 14 point system review is negative.  I calculated an Epworth sleepiness scale score on her initial evaluation and this endorsed at 24 and is consistent with significant excessive daytime sleepiness   PHYSICAL EXAM:   VS:  BP 106/72 (BP Location: Right Arm, Patient Position: Sitting, Cuff Size: Normal)   Pulse 78   Ht 5' 2"  (1.575 m)   Wt 142 lb 3.2 oz (64.5 kg)   LMP 03/03/2016   SpO2 100%   BMI 26.01 kg/m      Wt Readings from Last 3 Encounters:  03/22/16 142 lb 3.2 oz (64.5 kg)  02/23/16 139 lb 4 oz (63.2 kg)  02/16/16 140 lb (63.5 kg)    General: Alert, oriented, no distress.  Skin: normal turgor, no rashes,  warm and dry; multiple tattoos HEENT: Normocephalic, atraumatic. Pupils equal round and reactive to light; sclera anicteric; extraocular muscles intact; Fundi Without hemorrhages or exudates Nose without nasal septal hypertrophy; she has a nose ring Mouth/Parynx benign; Mallinpatti scale Neck: No JVD, no carotid bruits; normal carotid upstroke Lungs: clear to ausculatation and percussion; no wheezing or rales Chest wall: without tenderness to palpitation; bilateral breast implants Heart: PMI not displaced, RRR, s1 s2 normal, 1/6 systolic murmur, no diastolic murmur, no rubs, gallops, thrills, or heaves;  Abdomen: soft, nontender; no hepatosplenomehaly, BS+; abdominal aorta nontender and not dilated by palpation.- Back: no CVA tenderness Pulses 2+ Musculoskeletal: full range of motion, normal strength, no joint deformities Extremities: no clubbing cyanosis or edema, Homan's sign negative  Neurologic: grossly nonfocal; Cranial nerves grossly wnl Psychologic: Her affect today is normal.  She does not appear depressed and on her initial evaluation.  Her affect was very flat.   Studies/Labs  Reviewed:   ECG (independently read by me): Normal sinus rhythm at 69 bpm.  Normal intervals.  No ST segment changes.  No ectopy.  Recent Labs: BMP Latest Ref Rng & Units 02/23/2016 02/16/2016 09/05/2015  Glucose 65 - 99 mg/dL 73 92 90  BUN 7 - 25 mg/dL 10 8 17   Creatinine 0.50 - 1.10 mg/dL 0.77 0.71 0.68  Sodium 135 - 146 mmol/L 140 139 137  Potassium 3.5 - 5.3 mmol/L 4.9 4.1 4.5  Chloride 98 - 110 mmol/L 101 105 105  CO2 20 - 31 mmol/L 28 28 23   Calcium 8.6 - 10.2 mg/dL 9.6 9.3 8.9     Hepatic Function Latest Ref Rng & Units 02/23/2016 09/05/2015  Total Protein 6.1 - 8.1 g/dL 7.7 6.4(L)  Albumin 3.6 - 5.1 g/dL 4.6 3.8  AST 10 - 30 U/L 21 21  ALT 6 - 29 U/L 14 20  Alk Phosphatase 33 - 115 U/L 35 27(L)  Total Bilirubin 0.2 - 1.2 mg/dL 0.7 0.5    CBC Latest Ref Rng & Units 02/23/2016 02/16/2016 09/05/2015  WBC 3.8 - 10.8 K/uL 7.0 4.7 7.2  Hemoglobin 11.7 - 15.5 g/dL 13.4 12.5 10.3(L)  Hematocrit 35.0 - 45.0 % 41.1 39.4 33.2(L)  Platelets 140 - 400 K/uL 336 305 323   Lab Results  Component Value Date   MCV 83.7 02/23/2016   MCV 85.1 02/16/2016   MCV 85.6 09/05/2015   Lab Results  Component Value Date   TSH 0.60 02/23/2016   No results found for: HGBA1C   BNP No results found for: BNP  ProBNP No results found for: PROBNP   Lipid Panel     Component Value Date/Time   CHOL 305 (H) 02/23/2016 1024   TRIG 107 02/23/2016 1024   HDL 87 02/23/2016 1024   CHOLHDL 3.5 02/23/2016 1024   VLDL 21 02/23/2016 1024   LDLCALC 197 (H) 02/23/2016 1024     RADIOLOGY: Dg Shoulder Left  Result Date: 02/27/2016 CLINICAL DATA:  Left shoulder pain after fall down stairs yesterday. EXAM: LEFT SHOULDER - 2+ VIEW COMPARISON:  None. FINDINGS: Mildly displaced fracture is seen involving the posterior portion of the left third rib. No fracture is seen involving the visualize clavicle, scapula or humerus. No significant degenerative changes noted. IMPRESSION: Mildly displaced left third  rib fracture. No other abnormality seen in the left shoulder. Electronically Signed   By: Marijo Conception, M.D.   On: 02/27/2016 16:29   Dg Hand Complete Right  Result Date: 03/05/2016 CLINICAL DATA:  Injured 2 weeks  ago.  Pain with grasping. EXAM: RIGHT HAND - COMPLETE 3+ VIEW COMPARISON:  09/05/2015 FINDINGS: No evidence of acute or subacute fracture or dislocation. Congenital fusion of the lunate and triquetrum. IMPRESSION: No traumatic finding.  Congenital lunatotriquetral fusion. Electronically Signed   By: Nelson Chimes M.D.   On: 03/05/2016 11:40     Additional studies/ records that were reviewed today include:  I have reviewed ER nurse report from her [facility] emergency room evaluation.  I also have reviewed the encounter at [facility] by Aldona Bar    ASSESSMENT:    No diagnosis found.   PLAN:  Ms. Jaklyn Alen is a 35 year old female who I initially saw 2 months ago when she presented  with a three-month history of almost constant, sharp, pleuritic-like chest discomfort.  She has been evaluated in the emergency rooms.  Reportedly, a CT scan was normal. She denied any exertional precipitation of chest pain, fevers, chills or night sweats.  She had a history of  remoteLyme disease, but she states she was treated with IV antibiotics for up to 3 months 12 years ago.  This suggests that she had advanced Lyme disease and I am not certain if she developed any neurologic or cardiovascular complications.  I reviewed her studies that she had had since her initial evaluation.  Her echo Doppler study was entirely normal.  There was no evidence for pericardial effusion.  She had normal systolic and diastolic parameters.  Her routine treadmill test demonstrated excellent exercise tolerance without chest pain, development or ECG changes.  Her laboratory was all normal with the exception of marked hyperlipidemia.  She now is on atorvastatin 40 mg and has not yet had follow-up  laboratory.  This will be obtained after 3 months of treatment.  Discussed with her proper diet and she has significant leak changed her dietary intake since becoming aware of her marked hyperlipidemia.  I reassured her that her chest pain symptomatology did not appear to be ischemic etiology.  She does not have any signs of infection.  Her no markers of inflammation.  Specifically, her C-reactive protein was 0.7 which is excellent.  I discussed the importance of continued exercise.  Her blood pressure today remains stable.  I will see her in 6 months for reevaluation.   Medication Adjustments/Labs and Tests Ordered: Current medicines are reviewed at length with the patient today.  Concerns regarding medicines are outlined above.  Medication changes, Labs and Tests ordered today are listed in the Patient Instructions below.  Patient Instructions  Your physician wants you to follow-up in: 6 months or sooner if needed. You will receive a reminder letter in the mail two months in advance. If you don't receive a letter, please call our office to schedule the follow-up appointment.  If you need a refill on your cardiac medications before your next appointment, please call your pharmacy.     Signed, Shelva Majestic, MD  03/24/2016 8:02 AM    [facility] [address], [place], [place], [place]  [address] Phone: [phone]

## 2016-05-27 ENCOUNTER — Encounter (HOSPITAL_COMMUNITY): Payer: Self-pay | Admitting: Emergency Medicine

## 2016-05-27 ENCOUNTER — Ambulatory Visit (HOSPITAL_COMMUNITY)
Admission: EM | Admit: 2016-05-27 | Discharge: 2016-05-27 | Disposition: A | Discharge status: Nursing Home (Includes ICF) | Payer: Medicaid Other | Attending: Family Medicine | Admitting: Family Medicine

## 2016-05-27 ENCOUNTER — Emergency Department (HOSPITAL_COMMUNITY): Payer: Medicaid Other

## 2016-05-27 ENCOUNTER — Emergency Department (HOSPITAL_COMMUNITY)
Admission: EM | Admit: 2016-05-27 | Discharge: 2016-05-27 | Disposition: A | Discharge status: Home / Self Care | Payer: Medicaid Other | Attending: Emergency Medicine | Admitting: Emergency Medicine

## 2016-05-27 DIAGNOSIS — X58XXXA Exposure to other specified factors, initial encounter: Secondary | ICD-10-CM | POA: Insufficient documentation

## 2016-05-27 DIAGNOSIS — S2232XA Fracture of one rib, left side, initial encounter for closed fracture: Secondary | ICD-10-CM | POA: Diagnosis not present

## 2016-05-27 DIAGNOSIS — Y929 Unspecified place or not applicable: Secondary | ICD-10-CM | POA: Diagnosis not present

## 2016-05-27 DIAGNOSIS — Y939 Activity, unspecified: Secondary | ICD-10-CM | POA: Insufficient documentation

## 2016-05-27 DIAGNOSIS — Z79899 Other long term (current) drug therapy: Secondary | ICD-10-CM | POA: Diagnosis not present

## 2016-05-27 DIAGNOSIS — E039 Hypothyroidism, unspecified: Secondary | ICD-10-CM | POA: Diagnosis not present

## 2016-05-27 DIAGNOSIS — Y999 Unspecified external cause status: Secondary | ICD-10-CM | POA: Insufficient documentation

## 2016-05-27 DIAGNOSIS — S299XXA Unspecified injury of thorax, initial encounter: Secondary | ICD-10-CM | POA: Diagnosis present

## 2016-05-27 LAB — I-STAT TROPONIN, ED
TROPONIN I, POC: 0 ng/mL (ref 0.00–0.08)
Troponin i, poc: 0 ng/mL (ref 0.00–0.08)

## 2016-05-27 LAB — CBC
HEMATOCRIT: 36.8 % (ref 36.0–46.0)
HEMOGLOBIN: 11.7 g/dL — AB (ref 12.0–15.0)
MCH: 27.3 pg (ref 26.0–34.0)
MCHC: 31.8 g/dL (ref 30.0–36.0)
MCV: 85.8 fL (ref 78.0–100.0)
Platelets: 302 10*3/uL (ref 150–400)
RBC: 4.29 MIL/uL (ref 3.87–5.11)
RDW: 15.8 % — ABNORMAL HIGH (ref 11.5–15.5)
WBC: 5.1 10*3/uL (ref 4.0–10.5)

## 2016-05-27 LAB — BASIC METABOLIC PANEL
ANION GAP: 8 (ref 5–15)
BUN: 9 mg/dL (ref 6–20)
CALCIUM: 9.2 mg/dL (ref 8.9–10.3)
CO2: 25 mmol/L (ref 22–32)
Chloride: 107 mmol/L (ref 101–111)
Creatinine, Ser: 0.76 mg/dL (ref 0.44–1.00)
Glucose, Bld: 103 mg/dL — ABNORMAL HIGH (ref 65–99)
POTASSIUM: 4.1 mmol/L (ref 3.5–5.1)
SODIUM: 140 mmol/L (ref 135–145)

## 2016-05-27 LAB — I-STAT BETA HCG BLOOD, ED (MC, WL, AP ONLY)

## 2016-05-27 LAB — D-DIMER, QUANTITATIVE (NOT AT ARMC): D DIMER QUANT: 0.63 ug{FEU}/mL — AB (ref 0.00–0.50)

## 2016-05-27 MED ORDER — IOPAMIDOL (ISOVUE-370) INJECTION 76%
INTRAVENOUS | Status: AC
  Administered 2016-05-27: 100 mL
  Filled 2016-05-27: qty 100

## 2016-05-27 MED ORDER — NAPROXEN 500 MG PO TABS: 500.0000 mg | ORAL_TABLET | Freq: Two times a day (BID) | ORAL | 0 refills | Status: DC

## 2016-05-27 NOTE — ED Triage Notes (Signed)
Pt sts palpitations and SOB x 5 days

## 2016-05-27 NOTE — ED Triage Notes (Signed)
[facility] a [facility] of SOB and heart palpitations x 5 days.

## 2016-05-27 NOTE — ED Notes (Signed)
Patient transported to CT 

## 2016-05-27 NOTE — Discharge Instructions (Signed)
Please read and follow all provided instructions.  Your diagnoses today include:  1. Closed fracture of one rib of left side, initial encounter    Tests performed today include: Vital signs. See below for your results today.   Medications prescribed:  Take as prescribed   Home care instructions:  Follow any educational materials contained in this packet.  Follow-up instructions: Please follow-up with your primary care provider for further evaluation of symptoms and treatment   Return instructions:  Please return to the Emergency Department if you do not get better, if you get worse, or new symptoms OR  - Fever (temperature greater than 101.28F)  - Bleeding that does not stop with holding pressure to the area    -Severe pain (please note that you may be more sore the day after your accident)  - Chest Pain  - Difficulty breathing  - Severe nausea or vomiting  - Inability to tolerate food and liquids  - Passing out  - Skin becoming red around your wounds  - Change in mental status (confusion or lethargy)  - New numbness or weakness    Please return if you have any other emergent concerns.  Additional Information:  Your vital signs today were: BP 104/60 (BP Location: Right Arm)    Pulse (!) 48    Temp 98 F (36.7 C) (Oral)    Resp 19    Ht 5\' 2"  (1.575 m)    Wt 67.6 kg    LMP 05/27/2016    SpO2 100%    BMI 27.25 kg/m  If your blood pressure (BP) was elevated above 135/85 this visit, please have this repeated by your doctor within one month. ---------------

## 2016-05-27 NOTE — ED Notes (Signed)
Cleotis LemaW. Oxford-NP reviewed EKG and triage notes and [facility]. [facility] was [facility]

## 2016-05-27 NOTE — ED Provider Notes (Signed)
[facility]-EMERGENCY DEPT Provider Note   CSN: [id number] Arrival date & time: 05/27/16  1125     History   Chief Complaint Chief Complaint  Patient presents with  . Shortness of Breath  . Palpitations    HPI Brittney Saunders is a 36 y.o. female.  HPI  36 y.o. female, presents to the Emergency Department today complaining of SOB and heart palpitations x 5 days. Notes mild chest discomfort with palpations on and off for the past 5 days with no exacerbating factors. Notes no N/V/D. No diaphoresis. No hx ACS. No hx DVT/PE. No recent travel. No recent surgeries. Notes hx same in past and seen by Cardiologist and told it was likely aortic regurgitation. Minimal relief with OTC remedies. Notes pain currently 4/10. No other symptoms noted.    Past Medical History:  Diagnosis Date  . Hypothyroidism   . Lyme disease     There are no active problems to display for this patient.   Past Surgical History:  Procedure Laterality Date  . CESAREAN SECTION  2005, 2006, 2008  . HERNIA REPAIR      OB History    No data available       Home Medications    Prior to Admission medications   Medication Sig Start Date End Date Taking? Authorizing Provider  citalopram (CELEXA) 10 MG tablet Take 1 tablet (10 mg total) by mouth daily. After 2 weeks increase to 2 tablets daily. Patient taking differently: Take 20 mg by mouth daily.  03/05/16   Charm Rings, MD  levothyroxine (SYNTHROID, LEVOTHROID) 50 MCG tablet Take 50 mcg by mouth daily. 02/05/16   Historical Provider, MD    Family History Family History  Problem Relation Age of Onset  . Aneurysm Maternal Grandmother   . Congestive Heart Failure Maternal Grandfather   . Heart attack Maternal Uncle 35  . Other Maternal Aunt     infection of the heart    Social History Social History  Substance Use Topics  . Smoking status: Never Smoker  . Smokeless tobacco: Never Used  . Alcohol use Yes     Comment: occassional     Allergies     Prednisone   Review of Systems Review of Systems ROS reviewed and all are negative for acute change except as noted in the HPI.  Physical Exam Updated Vital Signs BP 127/77 (BP Location: Left Arm)   Pulse (!) 53   Temp 98 F (36.7 C) (Oral)   Resp 16   Ht 5\' 2"  (1.575 m)   Wt 67.6 kg   LMP 05/27/2016   SpO2 100%   BMI 27.25 kg/m   Physical Exam  Constitutional: She is oriented to person, place, and time. Vital signs are normal. She appears well-developed and well-nourished. No distress.  HENT:  Head: Normocephalic and atraumatic.  Right Ear: Hearing, tympanic membrane, external ear and ear canal normal.  Left Ear: Hearing, tympanic membrane, external ear and ear canal normal.  Nose: Nose normal.  Mouth/Throat: Uvula is midline, oropharynx is clear and moist and mucous membranes are normal. No trismus in the jaw. No oropharyngeal exudate, posterior oropharyngeal erythema or tonsillar abscesses.  Eyes: Conjunctivae and EOM are normal. Pupils are equal, round, and reactive to light.  Neck: Normal range of motion. Neck supple. No tracheal deviation present.  Cardiovascular: Normal rate, regular rhythm, S1 normal, S2 normal, normal heart sounds, intact distal pulses and normal pulses.   Pulmonary/Chest: Effort normal and breath sounds normal. No respiratory distress. She  has no decreased breath sounds. She has no wheezes. She has no rhonchi. She has no rales.  Abdominal: Normal appearance and bowel sounds are normal. There is no tenderness.  Musculoskeletal: Normal range of motion.  Neurological: She is alert and oriented to person, place, and time.  Skin: Skin is warm and dry.  Psychiatric: She has a normal mood and affect. Her speech is normal and behavior is normal. Thought content normal.  Nursing note and vitals reviewed.  ED Treatments / Results  Labs (all labs ordered are listed, but only abnormal results are displayed) Labs Reviewed  BASIC METABOLIC PANEL - Abnormal;  Notable for the following:       Result Value   Glucose, Bld 103 (*)    All other components within normal limits  CBC - Abnormal; Notable for the following:    Hemoglobin 11.7 (*)    RDW 15.8 (*)    All other components within normal limits  D-DIMER, QUANTITATIVE (NOT AT [facility]) - Abnormal; Notable for the following:    D-Dimer, Quant 0.63 (*)    All other components within normal limits  I-STAT TROPOININ, ED  I-STAT BETA HCG BLOOD, ED ([facility], [facility], [facility] ONLY)  I-STAT TROPOININ, ED    EKG  EKG Interpretation None       Radiology Dg Chest 2 View  Result Date: 05/27/2016 CLINICAL DATA:  Tachycardia, chest pain, and shortness of breath for the past 5 days. Sensation of a lump in the neck. EXAM: CHEST  2 VIEW COMPARISON:  Chest x-ray of February 16, 2016 FINDINGS: The lungs are adequately inflated. Subtle density in the left upper lobe is unchanged since October 2017 but is new since May 2017. The heart and pulmonary vascularity are normal. The mediastinum is normal in width. There is no pleural effusion. The bony thorax is unremarkable. Left upper lobe. IMPRESSION: Subtle density in the left upper lobe new since May 2017. This may reflect pneumonia but is nonspecific. Followup PA and lateral chest X-ray is recommended in 3-4 weeks following trial of antibiotic therapy to ensure resolution. Electronically Signed   By: David  SwazilandJordan M.D.   On: 05/27/2016 13:00   Ct Angio Chest Pe W And/or Wo Contrast  Result Date: 05/27/2016 CLINICAL DATA:  Chest pain x5 days with elevated D-dimer. EXAM: CT ANGIOGRAPHY CHEST WITH CONTRAST TECHNIQUE: Multidetector CT imaging of the chest was performed using the standard protocol during bolus administration of intravenous contrast. Multiplanar CT image reconstructions and MIPs were obtained to evaluate the vascular anatomy. CONTRAST:  80 cc of Isovue 370 IV COMPARISON:  CXR 05/27/2016, chest CT 09/05/2015 FINDINGS: Cardiovascular: The study is of quality for the  evaluation of pulmonary embolism. There are no filling defects in the central, lobar, segmental or subsegmental pulmonary artery branches to suggest acute pulmonary embolism. Great vessels are normal in course and caliber. Normal heart size. No significant pericardial fluid/thickening. Mediastinum/Nodes: No discrete thyroid nodules. Unremarkable esophagus. No pathologically enlarged axillary, mediastinal or hilar lymph nodes. Lungs/Pleura: No pneumothorax. No pulmonary consolidation nor effusion. Upper abdomen: Unremarkable.   Bilateral breast implants are noted. Musculoskeletal: Left anterior second rib fracture with partial osseous union suggesting a subacute injury. This is believed to account for the density seen on recent chest radiograph over the left upper lobe. Review of the MIP images confirms the above findings. IMPRESSION: 1. No acute pulmonary embolus. 2. No pulmonary consolidation or pneumothorax.  No effusion. 3. Subacute left anterior second rib fracture corresponding with the density seen on same  day chest radiograph over the left upper lobe. Electronically Signed   By: Tollie Eth M.D.   On: 05/27/2016 19:22    Procedures Procedures (including critical care time)  Medications Ordered in ED Medications - No data to display   Initial Impression / Assessment and Plan / ED Course  I have reviewed the triage vital signs and the nursing notes.  Pertinent labs & imaging results that were available during my care of the patient were reviewed by me and considered in my medical decision making (see chart for details).     Final Clinical Impressions(s) / ED Diagnoses  {I have reviewed and evaluated the relevant laboratory values. {I have reviewed and evaluated the relevant imaging studies. {I have interpreted the relevant EKG. {I have reviewed the relevant previous healthcare records.  {I obtained HPI from historian.   ED Course:  Assessment: Pt is a 35yF presents with CP x 5 days with  associated SOB. Hx same. Risk Factors HLD, FH. Patient is to be discharged with recommendation to follow up with PCP in regards to today's hospital visit. Chest pain is not likely of cardiac or pulmonary etiology d/t presentation, perc negative, VSS, no tracheal deviation, no JVD or new murmur, RRR, breath sounds equal bilaterally, EKG without acute abnormalities, negative troponinx2, and CXR shows left upper lobe density in the absence of URI symptoms obtained ad D dimer, which was elevated at 0.63. CT Angio Chest negative for PE. Showed subacute rib fracture. Pt has been advised start a NSAIDs. Return to the ED is CP becomes exertional, associated with diaphoresis or nausea, radiates to left jaw/arm, worsens or becomes concerning in any way. Pt appears reliable for follow up and is agreeable to discharge. Patient is in no acute distress. Vital Signs are stable. Patient is able to ambulate. Patient able to tolerate PO.   Disposition/Plan:  DC Home Additional Verbal discharge instructions given and discussed with patient.  Pt Instructed to f/u with PCP in the next week for evaluation and treatment of symptoms. Return precautions given Pt acknowledges and agrees with plan  Supervising Physician Rolland Porter, MD  Final diagnoses:  Closed fracture of one rib of left side, initial encounter    New Prescriptions New Prescriptions   No medications on file     Audry Pili, PA-C 05/27/16 1948    Rolland Porter, MD 06/06/16 [phone]

## 2016-06-02 NOTE — Progress Notes (Signed)
Cardiology Office Note    Date:  06/03/2016   ID:  Brittney Saunders, DOB 08/24/1980, MRN [id number]  PCP:  No PCP Per Patient  Cardiologist: Dr. Tresa EndoKelly  Chief Complaint  Patient presents with  . Palpitations    History of Present Illness:    Brittney Saunders is a 36 y.o. female with past medical history of Hypothyroidism and Lyme disease who presents to the office today for ER follow-up.   Was initially seen by Dr. Tresa EndoKelly in 02/2016 for evaluation of chest pain. Reported having chest discomfort occurring daily with minimal activity. Had chest wall tenderness on examination. An echocardiogram was performed which showed normal LV function of 60-65% with no wall motion abnormalities. Underwent ETT in 02/2016 showing great exercise tolerance with no ischemic changes noted on EKG. Was seen by Dr. Tresa EndoKelly in 03/2016 and doing well at that time, encouraged to follow-up in 6 months.  Was recently seen in the ER on 05/27/2016 for shortness of breath and palpitations for the past 5 days. CBC showed WBC 5.1, Hgb 11.7, platelets 302. Creatinine 0.76. I-stat and delta troponin negative. EKG showed NSR, HR 73, with no acute ST or T-wave changes.  D-dimer mildly elevated with CTA showing no evidence of acute PE, pulmonary consolidation, or pneumothorax. A subacute left anterior second rib fracture was noted. Was informed to start NSAIDS and follow-up with PCP within one week.   In talking with the patient today, she reports having continual palpitations occurring almost daily since her recent ER visit. Says they can occur while just sitting and have occasionally woken her up at night. They typically last for 2-5 minutes and spontaneously resolved. Reports associated dyspnea. Denies any lightheadedness, dizziness, or presyncope.  Reports having not taken her Synthroid for the past 6-8 weeks, due to her PCP no longer accepting Medicaid. Palpitations were present before this but have increased in frequency and  duration.  She denies any excessive caffeine use, only consuming one cup of coffee per day. No alcohol or tobacco use.    Past Medical History:  Diagnosis Date  . Chest pain    a. ETT in 02/2016 showing no ischemic changes, echo with preserved EF of 60-65% with no WMA.  . Hypothyroidism   . Lyme disease     Past Surgical History:  Procedure Laterality Date  . CESAREAN SECTION  2005, 2006, 2008  . HERNIA REPAIR      Current Medications: Outpatient Medications Prior to Visit  Medication Sig Dispense Refill  . citalopram (CELEXA) 10 MG tablet Take 1 tablet (10 mg total) by mouth daily. After 2 weeks increase to 2 tablets daily. (Patient not taking: Reported on 06/03/2016) 60 tablet 0  . levothyroxine (SYNTHROID, LEVOTHROID) 50 MCG tablet Take 50 mcg by mouth daily.  0  . naproxen (NAPROSYN) 500 MG tablet Take 1 tablet (500 mg total) by mouth 2 (two) times daily. 30 tablet 0   No facility-administered medications prior to visit.      Allergies:   Prednisone   Social History   Social History  . Marital status: Single    Spouse name: N/A  . Number of children: N/A  . Years of education: N/A   Social History Main Topics  . Smoking status: Never Smoker  . Smokeless tobacco: Never Used  . Alcohol use Yes     Comment: occassional  . Drug use: No  . Sexual activity: Not Asked   Other Topics Concern  . None   Social History Narrative  .  None     Family History:  The patient's family history includes Aneurysm in her maternal grandmother; Congestive Heart Failure in her maternal grandfather; Heart attack (age of onset: 59) in her maternal uncle; Other in her maternal aunt.   Review of Systems:   Please see the history of present illness.     General:  No chills, fever, night sweats or weight changes.  Cardiovascular:  No chest pain, dyspnea on exertion, edema, orthopnea, paroxysmal nocturnal dyspnea. Positive for palpitations.  Dermatological: No rash,  lesions/masses Respiratory: No cough, Positive for dyspnea Urologic: No hematuria, dysuria Abdominal:   No nausea, vomiting, diarrhea, bright red blood per rectum, melena, or hematemesis Neurologic:  No visual changes, wkns, changes in mental status. All other systems reviewed and are otherwise negative except as noted above.   Physical Exam:    VS:  BP 102/64   Pulse 60   Ht 5\' 2"  (1.575 m)   Wt 148 lb 6.4 oz (67.3 kg)   LMP 05/27/2016   BMI 27.14 kg/m    General: Well developed, well nourished Caucasian female appearing in no acute distress. Head: Normocephalic, atraumatic, sclera non-icteric, no xanthomas, nares are without discharge.  Neck: No carotid bruits. JVD not elevated.  Lungs: Respirations regular and unlabored, without wheezes or rales.  Heart: Regular rate and rhythm. No S3 or S4.  No murmur, no rubs, or gallops appreciated. Abdomen: Soft, non-tender, non-distended with normoactive bowel sounds. No hepatomegaly. No rebound/guarding. No obvious abdominal masses. Msk:  Strength and tone appear normal for age. No joint deformities or effusions. Extremities: No clubbing or cyanosis. No edema.  Distal pedal pulses are 2+ bilaterally. Neuro: Alert and oriented X 3. Moves all extremities spontaneously. No focal deficits noted. Psych:  Responds to questions appropriately with a normal affect. Skin: No rashes or lesions noted  Wt Readings from Last 3 Encounters:  06/03/16 148 lb 6.4 oz (67.3 kg)  05/27/16 149 lb (67.6 kg)  03/22/16 142 lb 3.2 oz (64.5 kg)     Studies/Labs Reviewed:   EKG:  EKG is not ordered today.    Recent Labs: 02/23/2016: ALT 14; TSH 0.60 05/27/2016: BUN 9; Creatinine, Ser 0.76; Hemoglobin 11.7; Platelets 302; Potassium 4.1; Sodium 140   Lipid Panel    Component Value Date/Time   CHOL 305 (H) 02/23/2016 1024   TRIG 107 02/23/2016 1024   HDL 87 02/23/2016 1024   CHOLHDL 3.5 02/23/2016 1024   VLDL 21 02/23/2016 1024   LDLCALC 197 (H)  02/23/2016 1024    Additional studies/ records that were reviewed today include:   Echocardiogram: 02/2016 Study Conclusions  - Left ventricle: The cavity size was normal. Systolic function was   normal. The estimated ejection fraction was in the range of 60%   to 65%. Wall motion was normal; there were no regional wall   motion abnormalities. - Aortic valve: There was trivial regurgitation.  Impressions:  - There was no evidence of a vegetation.  ETT: 02/2016  Blood pressure demonstrated a normal response to exercise.  There was no ST segment deviation noted during stress.   ETT with excellent exercise tolerance (15:01); no chest pain; normal BP response (SBP reported at 98 in stage 5 felt to be inaccurate); no ST changes; negative adequate ETT; Duke treadmill score 15.  Assessment:    1. Palpitations   2. Hypothyroidism, unspecified type   3. Atypical chest pain      Plan:   In order of problems listed above:  1.  Palpitations - reports worsening palpitations for the past month, now occurring more frequently (daily). Typically last for 2-5 minutes and spontaneously resolve. Reports associated dyspnea but denies any lightheadedness, dizziness, or presyncope. - recent CBC/BMET from 1/22 showed WBC 5.1, Hgb 11.7, platelets 302. K+ 4.1. Denies any excessive caffeine use, only consuming one cup of coffee per day. No alcohol or tobacco use. - will check TSH and Free T4 with plans to resume Synthroid as below.  - order 72 Hr Holter monitor to assess for any arrhythmias.  2. Hypothyroidism - previously on Synthroid 50 mcg daily but has been without this for 6-8 weeks due to her PCP no longer accepting Medicaid.  - will check TSH and Free T4 today. Restart Synthroid. Will provide Rx today as this could be contributing to her symptoms. She is establishing with a new PCP in 2 weeks. Is aware future refills will need to come from there.  3. Atypical Chest Pain - echo in  02/2016 showed normal LV function of 60-65% with no wall motion abnormalities. ETT with no ischemic changes noted on EKG. - no recurrent symptoms reported.    Medication Adjustments/Labs and Tests Ordered: Current medicines are reviewed at length with the patient today.  Concerns regarding medicines are outlined above.  Medication changes, Labs and Tests ordered today are listed in the Patient Instructions below. Patient Instructions  Medication Instructions:  Your physician recommends that you continue on your current medications as directed. Please refer to the Current Medication list given to you today.  Labwork: Your physician recommends that you have lab work TODAY (TSH, T4 free).  Testing/Procedures: Your physician has recommended that you wear a 72 HOUR holter monitor. Holter monitors are medical devices that record the heart's electrical activity. Doctors most often use these monitors to diagnose arrhythmias. Arrhythmias are problems with the speed or rhythm of the heartbeat. The monitor is a small, portable device. You can wear one while you do your normal daily activities. This is usually used to diagnose what is causing palpitations/syncope (passing out).   Follow-Up: Your physician wants you to follow-up in: 6 MONTHS WITH DR. Tresa Endo UNLESS MONITOR RESULTS ARE ABNORMAL. You will receive a reminder letter in the mail two months in advance. If you don't receive a letter, please call our office to schedule the follow-up appointment.   If you need a refill on your cardiac medications before your next appointment, please call your pharmacy    Signed, Ellsworth Lennox, [place]  06/03/2016 6:01 PM    [facility] [address], [address] [place], [place]  [address] Phone: [phone]; Fax: [phone]  [address] [place], [place] [address] Phone: [phone]

## 2016-06-03 ENCOUNTER — Ambulatory Visit (INDEPENDENT_AMBULATORY_CARE_PROVIDER_SITE_OTHER): Payer: Medicaid Other | Admitting: Student

## 2016-06-03 ENCOUNTER — Other Ambulatory Visit: Payer: Self-pay | Admitting: Student

## 2016-06-03 ENCOUNTER — Encounter: Payer: Self-pay | Admitting: Student

## 2016-06-03 VITALS — BP 102/64 | HR 60 | Ht 62.0 in | Wt 148.4 lb

## 2016-06-03 DIAGNOSIS — R0789 Other chest pain: Secondary | ICD-10-CM

## 2016-06-03 DIAGNOSIS — R002 Palpitations: Secondary | ICD-10-CM | POA: Diagnosis not present

## 2016-06-03 DIAGNOSIS — E039 Hypothyroidism, unspecified: Secondary | ICD-10-CM | POA: Diagnosis not present

## 2016-06-03 DIAGNOSIS — R0609 Other forms of dyspnea: Secondary | ICD-10-CM

## 2016-06-03 LAB — T4, FREE: Free T4: 1 ng/dL (ref 0.8–1.8)

## 2016-06-03 LAB — TSH: TSH: 1.16 mIU/L

## 2016-06-03 MED ORDER — LEVOTHYROXINE SODIUM 50 MCG PO TABS: 50.0000 ug | ORAL_TABLET | Freq: Every day | ORAL | 2 refills | Status: DC

## 2016-06-03 NOTE — Patient Instructions (Signed)
Medication Instructions:  Your physician recommends that you continue on your current medications as directed. Please refer to the Current Medication list given to you today.  Labwork: Your physician recommends that you have lab work TODAY (TSH, T4 free).   Testing/Procedures: Your physician has recommended that you wear a 72 HOUR holter monitor. Holter monitors are medical devices that record the heart's electrical activity. Doctors most often use these monitors to diagnose arrhythmias. Arrhythmias are problems with the speed or rhythm of the heartbeat. The monitor is a small, portable device. You can wear one while you do your normal daily activities. This is usually used to diagnose what is causing palpitations/syncope (passing out).   Follow-Up: Your physician wants you to follow-up in: 6 MONTHS WITH DR. Tresa EndoKELLY UNLESS MONITOR RESULTS ARE ABNORMAL. You will receive a reminder letter in the mail two months in advance. If you don't receive a letter, please call our office to schedule the follow-up appointment.     If you need a refill on your cardiac medications before your next appointment, please call your pharmacy.

## 2016-06-04 ENCOUNTER — Ambulatory Visit: Payer: Medicaid Other

## 2016-06-04 DIAGNOSIS — R002 Palpitations: Secondary | ICD-10-CM

## 2016-06-04 DIAGNOSIS — R0609 Other forms of dyspnea: Secondary | ICD-10-CM

## 2016-06-26 ENCOUNTER — Ambulatory Visit: Payer: Medicaid Other | Admitting: Podiatry

## 2016-06-28 ENCOUNTER — Ambulatory Visit (INDEPENDENT_AMBULATORY_CARE_PROVIDER_SITE_OTHER): Payer: Medicaid Other | Admitting: Obstetrics

## 2016-06-28 ENCOUNTER — Other Ambulatory Visit (HOSPITAL_COMMUNITY)
Admission: RE | Admit: 2016-06-28 | Discharge: 2016-06-28 | Disposition: A | Discharge status: Home / Self Care | Payer: Medicaid Other | Source: Ambulatory Visit | Attending: Obstetrics | Admitting: Obstetrics

## 2016-06-28 ENCOUNTER — Encounter: Payer: Self-pay | Admitting: Obstetrics

## 2016-06-28 VITALS — BP 127/79 | HR 80 | Ht 62.0 in | Wt 145.6 lb

## 2016-06-28 DIAGNOSIS — Z1151 Encounter for screening for human papillomavirus (HPV): Secondary | ICD-10-CM | POA: Insufficient documentation

## 2016-06-28 DIAGNOSIS — R102 Pelvic and perineal pain: Secondary | ICD-10-CM

## 2016-06-28 DIAGNOSIS — Z113 Encounter for screening for infections with a predominantly sexual mode of transmission: Secondary | ICD-10-CM | POA: Insufficient documentation

## 2016-06-28 DIAGNOSIS — N946 Dysmenorrhea, unspecified: Secondary | ICD-10-CM

## 2016-06-28 DIAGNOSIS — N898 Other specified noninflammatory disorders of vagina: Secondary | ICD-10-CM

## 2016-06-28 DIAGNOSIS — Z01419 Encounter for gynecological examination (general) (routine) without abnormal findings: Secondary | ICD-10-CM | POA: Diagnosis not present

## 2016-06-28 DIAGNOSIS — Z0000 Encounter for general adult medical examination without abnormal findings: Secondary | ICD-10-CM

## 2016-06-28 DIAGNOSIS — Z124 Encounter for screening for malignant neoplasm of cervix: Secondary | ICD-10-CM

## 2016-06-28 MED ORDER — NAPROXEN SODIUM 550 MG PO TABS: 550.0000 mg | ORAL_TABLET | Freq: Two times a day (BID) | ORAL | 5 refills | Status: DC

## 2016-06-28 NOTE — Progress Notes (Signed)
Subjective:        Brittney Saunders is a 36 y.o. female here for a routine exam.  Current complaints: Pelvic pain after intercourse over the past year.  Heavy and painful periods.  Has had leaking of urine with cough and sneeze over the past few months.  PSH significant for 3 C/S's and BTL.  PMH significant for Hypothyroidism, Lyme Disease and Depression  Personal health questionnaire:  Is patient Brittney Saunders, have a family history of breast and/or ovarian cancer: no Is there a family history of uterine cancer diagnosed at age < 17, gastrointestinal cancer, urinary tract cancer, family member who is a Personnel officer syndrome-associated carrier: no Is the patient overweight and hypertensive, family history of diabetes, personal history of gestational diabetes, preeclampsia or PCOS: no Is patient over 50, have PCOS,  family history of premature CHD under age 23, diabetes, smoke, have hypertension or peripheral artery disease:  no At any time, has a partner hit, kicked or otherwise hurt or frightened you?: no Over the past 2 weeks, have you felt down, depressed or hopeless?: no Over the past 2 weeks, have you felt little interest or pleasure in doing things?:no   Gynecologic History Patient's last menstrual period was 06/20/2016. Contraception: tubal ligation Last Pap: 2017. Results were: Normal Last mammogram: ~ 5 years ago. Results were: normal  Obstetric History OB History  Gravida Para Term Preterm AB Living  3 3 3     3   SAB TAB Ectopic Multiple Live Births               # Outcome Date GA Lbr Len/2nd Weight Sex Delivery Anes PTL Lv  3 Term           2 Term           1 Term               Past Medical History:  Diagnosis Date  . Chest pain    a. ETT in 02/2016 showing no ischemic changes, echo with preserved EF of 60-65% with no WMA.  . Hypothyroidism   . Lyme disease     Past Surgical History:  Procedure Laterality Date  . CESAREAN SECTION  2005, 2006, 2008  . HERNIA  REPAIR       Current Outpatient Prescriptions:  .  levothyroxine (SYNTHROID, LEVOTHROID) 50 MCG tablet, Take 1 tablet (50 mcg total) by mouth daily., Disp: 30 tablet, Rfl: 2 .  citalopram (CELEXA) 10 MG tablet, Take 1 tablet (10 mg total) by mouth daily. After 2 weeks increase to 2 tablets daily. (Patient not taking: Reported on 06/03/2016), Disp: 60 tablet, Rfl: 0 .  naproxen sodium (ANAPROX DS) 550 MG tablet, Take 1 tablet (550 mg total) by mouth 2 (two) times daily with a meal., Disp: 60 tablet, Rfl: 5 Allergies  Allergen Reactions  . Prednisone Other (See Comments)    Doesn't help     Social History  Substance Use Topics  . Smoking status: Never Smoker  . Smokeless tobacco: Never Used  . Alcohol use Yes     Comment: occassional    Family History  Problem Relation Age of Onset  . Aneurysm Maternal Grandmother   . Cancer - Ovarian Maternal Grandmother   . Congestive Heart Failure Maternal Grandfather   . Heart attack Maternal Uncle 35  . Other Maternal Aunt     infection of the heart  . Breast cancer Maternal Aunt       Review of Systems  Constitutional: negative for fatigue and weight loss Respiratory: negative for cough and wheezing Cardiovascular: negative for chest pain, fatigue and palpitations Gastrointestinal: negative for abdominal pain and change in bowel habits Musculoskeletal:negative for myalgias Neurological: negative for gait problems and tremors Behavioral/Psych: negative for abusive relationship, depression Endocrine: negative for temperature intolerance    Genitourinary:negative for abnormal menstrual periods, genital lesions, hot flashes, sexual problems and vaginal discharge Integument/breast: negative for breast lump, breast tenderness, nipple discharge and skin lesion(s)    Objective:       BP 127/79   Pulse 80   Ht 5\' 2"  (1.575 m)   Wt 145 lb 9.6 oz (66 kg)   LMP 06/20/2016   BMI 26.63 kg/m  General:   alert  Skin:   no rash or  abnormalities  Lungs:   clear to auscultation bilaterally  Heart:   regular rate and rhythm, S1, S2 normal, no murmur, click, rub or gallop  Breasts:   normal without suspicious masses, skin or nipple changes or axillary nodes  Abdomen:  normal findings: no organomegaly, soft, non-tender and no hernia  Pelvis:  External genitalia: normal general appearance Urinary system: urethral meatus normal and bladder without fullness, nontender Vaginal: normal without tenderness, induration or masses Cervix: normal appearance Adnexa: normal bimanual exam Uterus: anteverted and non-tender, normal size   Lab Review Urine pregnancy test Labs reviewed yes Radiologic studies reviewed yes  50% of 20 min visit spent on counseling and coordination of care.    Assessment:    Healthy female exam.    Pelvic pain after intercourse  Dyspareunia  SUI   Plan:   Ultrasound ordered Anaprox DS Rx  Education reviewed: calcium supplements, low fat, low cholesterol diet, safe sex/STD prevention, self breast exams and weight bearing exercise. Follow up in: 2 weeks.   Meds ordered this encounter  Medications  . naproxen sodium (ANAPROX DS) 550 MG tablet    Sig: Take 1 tablet (550 mg total) by mouth 2 (two) times daily with a meal.    Dispense:  60 tablet    Refill:  5   Orders Placed This Encounter  Procedures  . US Pelvis Complete    Standing Status:   Future    Standing Expiration Date:   08/26/2017    Order Specific Question:   Reason for Exam (SYMPTOM  OR DIAGNOSIS REQUIRED)    Answer:   Pelvic pain    Order Specific Question:   Preferred imaging location?    [facility]  . US Transvaginal Non-OB    Standing Status:   Future    Standing Expiration Date:   08/26/2017    Order Specific Question:   Reason for Exam (SYMPTOM  OR DIAGNOSIS REQUIRED)    Answer:   Pelvic pain    Order Specific Question:   Preferred imaging location?    Answer:   [facility]     Patient ID:  Brittney Saunders, female   DOB: 04/05/1981, 36 y.o.   MRN: [id number]

## 2016-06-28 NOTE — Progress Notes (Signed)
Patient is in the office for annual exam- she is having painful intercourse, painful, heavy cycles, irregular cycles- every 2-3 weeks.

## 2016-07-01 LAB — CERVICOVAGINAL ANCILLARY ONLY
Bacterial vaginitis: POSITIVE — AB
Candida vaginitis: NEGATIVE

## 2016-07-02 ENCOUNTER — Other Ambulatory Visit: Payer: Self-pay | Admitting: Obstetrics

## 2016-07-02 DIAGNOSIS — B9689 Other specified bacterial agents as the cause of diseases classified elsewhere: Secondary | ICD-10-CM

## 2016-07-02 DIAGNOSIS — N760 Acute vaginitis: Principal | ICD-10-CM

## 2016-07-02 LAB — CYTOLOGY - PAP
DIAGNOSIS: NEGATIVE
HPV (WINDOPATH): NOT DETECTED

## 2016-07-02 MED ORDER — TINIDAZOLE 500 MG PO TABS: 1000.0000 mg | ORAL_TABLET | Freq: Every day | ORAL | 2 refills | Status: DC

## 2016-07-05 ENCOUNTER — Ambulatory Visit: Payer: Medicaid Other | Admitting: Podiatry

## 2016-07-05 ENCOUNTER — Ambulatory Visit (HOSPITAL_COMMUNITY)
Admission: RE | Admit: 2016-07-05 | Discharge: 2016-07-05 | Disposition: A | Discharge status: Home / Self Care | Payer: Medicaid Other | Source: Ambulatory Visit | Attending: Obstetrics | Admitting: Obstetrics

## 2016-07-05 DIAGNOSIS — R102 Pelvic and perineal pain: Secondary | ICD-10-CM | POA: Diagnosis present

## 2016-07-05 DIAGNOSIS — N926 Irregular menstruation, unspecified: Secondary | ICD-10-CM | POA: Insufficient documentation

## 2016-07-11 ENCOUNTER — Ambulatory Visit: Payer: Medicaid Other | Admitting: Obstetrics

## 2016-08-07 ENCOUNTER — Other Ambulatory Visit: Payer: Self-pay | Admitting: Student

## 2016-08-08 NOTE — Telephone Encounter (Signed)
REFILL 

## 2016-09-17 ENCOUNTER — Encounter (HOSPITAL_COMMUNITY): Payer: Self-pay | Admitting: Emergency Medicine

## 2016-09-17 ENCOUNTER — Ambulatory Visit (HOSPITAL_COMMUNITY)
Admission: EM | Admit: 2016-09-17 | Discharge: 2016-09-17 | Disposition: A | Discharge status: Home / Self Care | Payer: Medicaid Other | Attending: Internal Medicine | Admitting: Internal Medicine

## 2016-09-17 DIAGNOSIS — R42 Dizziness and giddiness: Secondary | ICD-10-CM

## 2016-09-17 DIAGNOSIS — H6983 Other specified disorders of Eustachian tube, bilateral: Secondary | ICD-10-CM | POA: Diagnosis not present

## 2016-09-17 MED ORDER — MECLIZINE HCL 25 MG PO TABS: 25.0000 mg | ORAL_TABLET | Freq: Three times a day (TID) | ORAL | 0 refills | Status: DC | PRN

## 2016-09-17 NOTE — Discharge Instructions (Signed)
Recommend trial Meclizine 25mg  every 8 hours as needed for dizziness. Change positions slowly to minimize symptoms. Recommend follow-up with your primary care provider in 3 to 4 days if not improving or go to ER if symptoms worsen.

## 2016-09-17 NOTE — ED Provider Notes (Signed)
CSN: [id number]     Arrival date & time 09/17/16  0957 History   First MD Initiated Contact with Patient 09/17/16 1030     Chief Complaint  Patient presents with  . Dizziness   (Consider location/radiation/quality/duration/timing/severity/associated sxs/prior Treatment) 36 year old female presents with concern over dizziness for the past 2 to 3 days. Feels like the room is spinning when she moves her head down or to the side. Denies any fever, ear pain, difficulty breathing, chest pain, palpitations, vomiting or diarrhea. Also having some nausea and headaches with the dizziness. She did have a recent URI but still having mild nasal congestion.She has not taken any medications for symptoms. She has a history of  Hypothyroidism and takes generic Synthroid daily.    The history is provided by the patient.    Past Medical History:  Diagnosis Date  . Chest pain    a. ETT in 02/2016 showing no ischemic changes, echo with preserved EF of 60-65% with no WMA.  . Hypothyroidism   . Lyme disease    Past Surgical History:  Procedure Laterality Date  . CESAREAN SECTION  2005, 2006, 2008  . HERNIA REPAIR     Family History  Problem Relation Age of Onset  . Aneurysm Maternal Grandmother   . Cancer - Ovarian Maternal Grandmother   . Congestive Heart Failure Maternal Grandfather   . Heart attack Maternal Uncle 35  . Other Maternal Aunt        infection of the heart  . Breast cancer Maternal Aunt    Social History  Substance Use Topics  . Smoking status: Never Smoker  . Smokeless tobacco: Never Used  . Alcohol use Yes     Comment: occassional   OB History    Gravida Para Term Preterm AB Living   3 3 3     3    SAB TAB Ectopic Multiple Live Births                 Review of Systems  Constitutional: Positive for appetite change and fatigue. Negative for chills, diaphoresis and fever.  HENT: Positive for postnasal drip. Negative for congestion, ear discharge, ear pain, facial swelling,  sinus pain, sinus pressure, sneezing, sore throat, tinnitus and trouble swallowing.   Eyes: Negative for discharge, itching and visual disturbance.  Respiratory: Negative for cough, chest tightness, shortness of breath and wheezing.   Cardiovascular: Negative for chest pain.  Gastrointestinal: Positive for nausea. Negative for abdominal pain, diarrhea and vomiting.  Musculoskeletal: Negative for arthralgias, myalgias, neck pain and neck stiffness.  Skin: Negative for rash and wound.  Neurological: Positive for dizziness, light-headedness and headaches. Negative for tremors, seizures, syncope, facial asymmetry, speech difficulty, weakness and numbness.  Hematological: Negative for adenopathy.    Allergies  Prednisone  Home Medications   Prior to Admission medications   Medication Sig Start Date End Date Taking? Authorizing Provider  levothyroxine (SYNTHROID, LEVOTHROID) 50 MCG tablet TAKE 1 TABLET BY MOUTH EVERY DAY 08/08/16   [place] Ouch, [place] M, PA-C  meclizine (ANTIVERT) 25 MG tablet Take 1 tablet (25 mg total) by mouth 3 (three) times daily as needed for dizziness. 09/17/16   Sudie Grumbling, NP   Meds Ordered and Administered this Visit  Medications - No data to display  BP 117/80 (BP Location: Right Arm)   Pulse 70   Temp 98.3 F (36.8 C) (Oral)   Resp 16   LMP 09/14/2016   SpO2 98%  No data found.   Physical  Exam  Constitutional: She is oriented to person, place, and time. She appears well-developed and well-nourished. She is cooperative. She does not appear ill. No distress.  HENT:  Head: Normocephalic and atraumatic.  Right Ear: Hearing, external ear and ear canal normal. Tympanic membrane is bulging. Tympanic membrane is not injected and not erythematous. A middle ear effusion is present.  Left Ear: Hearing, external ear and ear canal normal. Tympanic membrane is bulging. Tympanic membrane is not injected and not erythematous. A middle ear effusion is present.  Nose:  Nose normal. Right sinus exhibits no maxillary sinus tenderness and no frontal sinus tenderness. Left sinus exhibits no maxillary sinus tenderness and no frontal sinus tenderness.  Mouth/Throat: Uvula is midline, oropharynx is clear and moist and mucous membranes are normal.  Right ear canal with soft brown cerumen present- not completely blocking canal  Flexing and rotating neck cause the sensation of dizziness   Eyes: Conjunctivae and EOM are normal. Pupils are equal, round, and reactive to light. Right eye exhibits no discharge. Left eye exhibits no discharge.  Fundoscopic exam:      The right eye shows no papilledema. The right eye shows red reflex.       The left eye shows no papilledema. The left eye shows red reflex.  Neck: Normal range of motion. Neck supple. No JVD present. No muscular tenderness present. No neck rigidity. No edema and normal range of motion present.  Cardiovascular: Normal rate, regular rhythm and normal heart sounds.   No murmur heard. Pulmonary/Chest: Effort normal and breath sounds normal. No respiratory distress. She has no decreased breath sounds. She has no wheezes.  Musculoskeletal: Normal range of motion.  Lymphadenopathy:    She has no cervical adenopathy.  Neurological: She is alert and oriented to person, place, and time. She has normal strength. No cranial nerve deficit or sensory deficit. Coordination and gait normal.  Skin: Skin is warm and dry.  Psychiatric: She has a normal mood and affect. Her speech is normal and behavior is normal. Judgment and thought content normal.  Vitals reviewed.   Urgent Care Course     Procedures (including critical care time)  Labs Review Labs Reviewed - No data to display  Imaging Review No results found.   Visual Acuity Review  Right Eye Distance:   Left Eye Distance:   Bilateral Distance:    Right Eye Near:   Left Eye Near:    Bilateral Near:         MDM   1. Dizziness   2. Eustachian tube  dysfunction, bilateral    Discussed that dizziness may be caused by fluid present behind her ear drums due to recent URI. Recommend trial Meclizine 25mg  every 8 hours as needed. Change positions slowly to minimize symptoms. May need to see an ENT. Recommend follow-up with her primary care provider in 3 to 4 days if not improving or go to ER if symptoms worsen.      Sudie GrumblingAmyot, Scarleth Brame Berry, NP 09/17/16 1606

## 2016-09-17 NOTE — ED Triage Notes (Signed)
Lying down, moving head quickly results in room spinning.  No history of this

## 2016-10-06 ENCOUNTER — Encounter (HOSPITAL_COMMUNITY): Payer: Self-pay | Admitting: Emergency Medicine

## 2016-10-06 ENCOUNTER — Ambulatory Visit (HOSPITAL_COMMUNITY)
Admission: EM | Admit: 2016-10-06 | Discharge: 2016-10-06 | Disposition: A | Discharge status: Home / Self Care | Payer: Medicaid Other | Attending: Internal Medicine | Admitting: Internal Medicine

## 2016-10-06 DIAGNOSIS — B9689 Other specified bacterial agents as the cause of diseases classified elsewhere: Secondary | ICD-10-CM

## 2016-10-06 DIAGNOSIS — J019 Acute sinusitis, unspecified: Secondary | ICD-10-CM | POA: Diagnosis not present

## 2016-10-06 DIAGNOSIS — J209 Acute bronchitis, unspecified: Secondary | ICD-10-CM

## 2016-10-06 MED ORDER — AMOXICILLIN-POT CLAVULANATE 875-125 MG PO TABS: 1.0000 | ORAL_TABLET | Freq: Two times a day (BID) | ORAL | 0 refills | Status: DC

## 2016-10-06 MED ORDER — ALBUTEROL SULFATE HFA 108 (90 BASE) MCG/ACT IN AERS: 1.0000 | INHALATION_SPRAY | Freq: Four times a day (QID) | RESPIRATORY_TRACT | 0 refills | Status: DC | PRN

## 2016-10-06 NOTE — Discharge Instructions (Addendum)
Anticipate gradual improvement over the next several days.  Cough and congestion may take a couple weeks to fully subside.  Prescriptions for amoxicillin/clavulanate (antibiotic) and albuterol inhaler (for cough/chest tightness) were sent to the pharmacy.  Recheck for new fever >100.5, increasing phlegm production/nasal discharge, or if not starting to improve in a few days.

## 2016-10-06 NOTE — ED Triage Notes (Signed)
[facility] with a complaint of a cough, general body aches and fever x 4 days.

## 2016-10-06 NOTE — ED Provider Notes (Signed)
[facility]    CSN: [id number] Arrival date & time: 10/06/16  [id number]     History   Chief Complaint Chief Complaint  Patient presents with  . Cough    HPI Brittney Saunders is a 36 y.o. female. She presents today with 3 day history of severe head congestion, nonstop discharge of nasal secretions (has tissues stuffed into her nose), cough, malaise. No fever. Had similar symptoms a few weeks ago. States an allergy to prednisone, says that it exacerbates her chronic Lyme disease symptoms.    HPI  Past Medical History:  Diagnosis Date  . Chest pain    a. ETT in 02/2016 showing no ischemic changes, echo with preserved EF of 60-65% with no WMA.  . Hypothyroidism   . Lyme disease      Past Surgical History:  Procedure Laterality Date  . CESAREAN SECTION  2005, 2006, 2008  . HERNIA REPAIR      OB History    Gravida Para Term Preterm AB Living   3 3 3     3    SAB TAB Ectopic Multiple Live Births                   Home Medications    Prior to Admission medications   Medication Sig Start Date End Date Taking? Authorizing Provider  levothyroxine (SYNTHROID, LEVOTHROID) 50 MCG tablet TAKE 1 TABLET BY MOUTH EVERY DAY 08/08/16  Yes Strader, GrenadaBrittany M, PA-C  albuterol (PROVENTIL HFA;VENTOLIN HFA) 108 (90 Base) MCG/ACT inhaler Inhale 1-2 puffs into the lungs every 6 (six) hours as needed for wheezing or shortness of breath. 10/06/16   Eustace MooreMurray, Lindsea Olivar W, MD  amoxicillin-clavulanate (AUGMENTIN) 875-125 MG tablet Take 1 tablet by mouth every 12 (twelve) hours. 10/06/16   Eustace MooreMurray, Matti Minney W, MD    Family History Family History  Problem Relation Age of Onset  . Aneurysm Maternal Grandmother   . Cancer - Ovarian Maternal Grandmother   . Congestive Heart Failure Maternal Grandfather   . Heart attack Maternal Uncle 35  . Other Maternal Aunt        infection of the heart  . Breast cancer Maternal Aunt     Social History Social History  Substance Use Topics  . Smoking status:  Never Smoker  . Smokeless tobacco: Never Used  . Alcohol use Yes     Comment: occassional     Allergies   Prednisone   Review of Systems Review of Systems  All other systems reviewed and are negative.    Physical Exam Triage Vital Signs ED Triage Vitals [10/06/16 1933]  Enc Vitals Group     BP 109/61     Pulse Rate 84     Resp 18     Temp 98.5 F (36.9 C)     Temp Source Oral     SpO2 99 %     Weight      Height      Pain Score 8     Pain Loc    Updated Vital Signs BP 109/61 (BP Location: Right Arm)   Pulse 84   Temp 98.5 F (36.9 C) (Oral)   Resp 18   LMP 09/14/2016   SpO2 99%   Physical Exam  Constitutional: She is oriented to person, place, and time.  Looks ill but not toxic. Able to sit up on the end of the exam table, was laying down initially Has tissues stuffed into both nostrils Voice sounds very congested Diffusely sunburned  HENT:  Head: Atraumatic.  Eyes:  Conjugate gaze observed, no eye redness/discharge  Neck: Neck supple.  Cardiovascular: Normal rate and regular rhythm.   Pulmonary/Chest: No respiratory distress. She has no wheezes. She has no rales.  Coarse but symmetric breath sounds throughout No frank wheezing  Abdominal: She exhibits no distension.  Musculoskeletal: Normal range of motion.  Neurological: She is alert and oriented to person, place, and time.  Skin: Skin is warm and dry.  Diffuse erythroderma in sun exposed areas  Nursing note and vitals reviewed.    UC Treatments / Results   Procedures Procedures (including critical care time) None today  Final Clinical Impressions(s) / UC Diagnoses   Final diagnoses:  Acute bacterial sinusitis  Acute bronchitis with bronchospasm   Anticipate gradual improvement over the next several days.  Cough and congestion may take a couple weeks to fully subside.  Prescriptions for amoxicillin/clavulanate (antibiotic) and albuterol inhaler (for cough/chest tightness) were sent to  the pharmacy.  Recheck for new fever >100.5, increasing phlegm production/nasal discharge, or if not starting to improve in a few days.    New Prescriptions New Prescriptions   ALBUTEROL (PROVENTIL HFA;VENTOLIN HFA) 108 (90 BASE) MCG/ACT INHALER    Inhale 1-2 puffs into the lungs every 6 (six) hours as needed for wheezing or shortness of breath.   AMOXICILLIN-CLAVULANATE (AUGMENTIN) 875-125 MG TABLET    Take 1 tablet by mouth every 12 (twelve) hours.     Eustace Moore, MD 10/07/16 1054

## 2016-12-25 ENCOUNTER — Encounter: Payer: Self-pay | Admitting: *Deleted

## 2016-12-25 NOTE — Progress Notes (Signed)
Patient ID: Brittney Saunders, female   DOB: 1980/08/11, 36 y.o.   MRN: [id number]  48 hour holter monitor was applied to patient on 06/04/2016 and was due to be returned to our office on 06/06/2016.  As of 12/25/2016, the holter monitor has not been returned to our office despite our requests.  [employer] has been contacted regarding the patient not returning their equipment.  [employer] will be billing the patient for the unreturned equipment.

## 2016-12-31 ENCOUNTER — Other Ambulatory Visit: Payer: Self-pay | Admitting: Obstetrics and Gynecology

## 2016-12-31 DIAGNOSIS — N6002 Solitary cyst of left breast: Secondary | ICD-10-CM

## 2017-01-02 ENCOUNTER — Ambulatory Visit
Admission: RE | Admit: 2017-01-02 | Discharge: 2017-01-02 | Disposition: A | Discharge status: Home / Self Care | Payer: Medicaid Other | Source: Ambulatory Visit | Attending: Obstetrics and Gynecology | Admitting: Obstetrics and Gynecology

## 2017-01-02 ENCOUNTER — Ambulatory Visit
Admission: RE | Admit: 2017-01-02 | Discharge: 2017-01-02 | Disposition: A | Discharge status: Home / Self Care | Payer: BLUE CROSS/BLUE SHIELD | Source: Ambulatory Visit | Attending: Obstetrics and Gynecology | Admitting: Obstetrics and Gynecology

## 2017-01-02 DIAGNOSIS — N6002 Solitary cyst of left breast: Secondary | ICD-10-CM

## 2017-01-10 ENCOUNTER — Other Ambulatory Visit: Payer: Self-pay | Admitting: Student

## 2017-01-10 NOTE — Telephone Encounter (Signed)
Rx has been sent to the pharmacy electronically. ° °

## 2017-01-16 ENCOUNTER — Encounter: Payer: Self-pay | Admitting: Physician Assistant

## 2017-01-16 ENCOUNTER — Ambulatory Visit (INDEPENDENT_AMBULATORY_CARE_PROVIDER_SITE_OTHER): Payer: BLUE CROSS/BLUE SHIELD | Admitting: Physician Assistant

## 2017-01-16 VITALS — BP 130/82 | HR 54 | Ht 62.0 in | Wt 141.0 lb

## 2017-01-16 DIAGNOSIS — Z7689 Persons encountering health services in other specified circumstances: Secondary | ICD-10-CM

## 2017-01-16 DIAGNOSIS — R591 Generalized enlarged lymph nodes: Secondary | ICD-10-CM | POA: Diagnosis not present

## 2017-01-16 DIAGNOSIS — M545 Low back pain, unspecified: Secondary | ICD-10-CM

## 2017-01-16 DIAGNOSIS — E039 Hypothyroidism, unspecified: Secondary | ICD-10-CM | POA: Diagnosis not present

## 2017-01-16 DIAGNOSIS — R001 Bradycardia, unspecified: Secondary | ICD-10-CM | POA: Diagnosis not present

## 2017-01-16 DIAGNOSIS — R5383 Other fatigue: Secondary | ICD-10-CM | POA: Diagnosis not present

## 2017-01-16 MED ORDER — MELOXICAM 15 MG PO TABS: 15.0000 mg | ORAL_TABLET | Freq: Every day | ORAL | 0 refills | Status: DC

## 2017-01-16 NOTE — Patient Instructions (Addendum)
For back pain: - Meloxicam daily for 2 weeks, then daily as needed - Ice or heat x 20 minutes 3 times daily, whichever feels better - Foam roller - TENS unit (available on [employer]- TENS 7000 2nd ed) - Rehab exercises daily or formal physical therapy    Fatigue Fatigue is feeling tired all of the time, a lack of energy, or a lack of motivation. Occasional or mild fatigue is often a normal response to activity or life in general. However, long-lasting (chronic) or extreme fatigue may indicate an underlying medical condition. Follow these instructions at home: Watch your fatigue for any changes. The following actions may help to lessen any discomfort you are feeling:  Talk to your health care provider about how much sleep you need each night. Try to get the required amount every night.  Take medicines only as directed by your health care provider.  Eat a healthy and nutritious diet. Ask your health care provider if you need help changing your diet.  Drink enough fluid to keep your urine clear or pale yellow.  Practice ways of relaxing, such as yoga, meditation, massage therapy, or acupuncture.  Exercise regularly.  Change situations that cause you stress. Try to keep your work and personal routine reasonable.  Do not abuse illegal drugs.  Limit alcohol intake to no more than 1 drink per day for nonpregnant women and 2 drinks per day for men. One drink equals 12 ounces of beer, 5 ounces of wine, or 1 ounces of hard liquor.  Take a multivitamin, if directed by your health care provider.  Contact a health care provider if:  Your fatigue does not get better.  You have a fever.  You have unintentional weight loss or gain.  You have headaches.  You have difficulty: ? Falling asleep. ? Sleeping throughout the night.  You feel angry, guilty, anxious, or sad.  You are unable to have a bowel movement (constipation).  You skin is dry.  Your legs or another part of your body is  swollen. Get help right away if:  You feel confused.  Your vision is blurry.  You feel faint or pass out.  You have a severe headache.  You have severe abdominal, pelvic, or back pain.  You have chest pain, shortness of breath, or an irregular or fast heartbeat.  You are unable to urinate or you urinate less than normal.  You develop abnormal bleeding, such as bleeding from the rectum, vagina, nose, lungs, or nipples.  You vomit blood.  You have thoughts about harming yourself or committing suicide.  You are worried that you might harm someone else. This information is not intended to replace advice given to you by your health care provider. Make sure you discuss any questions you have with your health care provider. Document Released: 02/17/2007 Document Revised: 09/28/2015 Document Reviewed: 08/24/2013 Elsevier Interactive Patient Education  [employer]2018 [employer]

## 2017-01-16 NOTE — Progress Notes (Signed)
HPI:                                                                Brittney Saunders is a 36 y.o. female who [facility] [place] [facility] Medicine today to establish care  Hypothyroidism: patient is taking Levothyroxine 50 mcg daily for the last 3 years. She reports recurrent symptoms of hypothyroidism including abnormal weight gain, hair loss, constipation, and fatigue. Last TSH 1.16, 7 months ago. Reports symptoms present for 3-4 weeks. Denies rashes or joint pain. Does endorse low back pain for over a month.   Reports she was diagnosed with Lyme disease 10 years ago. Endorses intermittent myalgias since that time. States she has felt swollen lymph nodes in her neck.  LBP: onset 1 month ago. Pain is described as an ache, moderate, persistent in the bilateral lumbar region. Does not radiate. Denies paresthesias/radiular symptoms. Denies known injury or trauma. Denies bowel or bladder dysfunction or saddle numbness.   Past Medical History:  Diagnosis Date  . Anemia   . Chest pain    a. ETT in 02/2016 showing no ischemic changes, echo with preserved EF of 60-65% with no WMA.  . Hyperlipidemia   . Hypothyroidism   . Lyme disease    Past Surgical History:  Procedure Laterality Date  . AUGMENTATION MAMMAPLASTY Bilateral 2014   Saline, retropectoral  . CESAREAN SECTION  2005, 2006, 2008  . HERNIA REPAIR    . TUBAL LIGATION     Social History  Substance Use Topics  . Smoking status: Never Smoker  . Smokeless tobacco: Never Used  . Alcohol use Yes     Comment: occassional   family history includes Aneurysm in her maternal grandmother; Breast cancer in her maternal aunt, maternal aunt, maternal grandmother, and paternal grandmother; Cancer - Ovarian in her maternal grandmother; Congestive Heart Failure in her maternal grandfather; Heart attack (age of onset: 5135) in her maternal uncle; Other in her maternal aunt.  ROS: Review of Systems   Constitutional: Positive for malaise/fatigue.  HENT: Negative.   Eyes: Negative.   Respiratory: Negative.   Cardiovascular: Negative.   Gastrointestinal: Positive for constipation. Negative for blood in stool and melena.  Genitourinary: Negative.   Musculoskeletal: Positive for back pain.  Skin: Negative.   Neurological: Positive for headaches.  Psychiatric/Behavioral: Negative.      Medications: Current Outpatient Prescriptions  Medication Sig Dispense Refill  . levothyroxine (SYNTHROID, LEVOTHROID) 50 MCG tablet TAKE 1 TABLET BY MOUTH EVERY DAY 30 tablet 2  . meloxicam (MOBIC) 15 MG tablet Take 1 tablet (15 mg total) by mouth daily. 30 tablet 0   No current facility-administered medications for this visit.    Allergies  Allergen Reactions  . Prednisone Other (See Comments)    Doesn't help      Objective:  BP 130/82   Pulse (!) 54   Ht 5\' 2"  (1.575 m)   Wt 141 lb (64 kg)   BMI 25.79 kg/m  Gen:  alert, not ill-appearing, no distress, appropriate for age HEENT: head normocephalic without obvious abnormality, conjunctiva and cornea clear, no thyromegaly or tenderness, trachea midline Pulm: Normal work of breathing, normal phonation, clear to auscultation bilaterally CV: bradycardic, regular rhythm, s1 and s2 distinct, no murmurs clicks or  rubs Neuro: alert and oriented x 3, no tremor MSK: extremities atraumatic, no joint tenderness or edema, normal gait and station Back: atraumatic, no abnormal curvature, full active ROM, pain is not reproducible Skin: intact, no rashes on exposed skin, no jaundice, no cyanosis Lymph: no cervical or supraclavicular adenopathy Psych: well-groomed, cooperative, good eye contact, euthymic mood, affect mood-congruent, speech is articulate, and thought processes clear and goal-directed   No results found for this or any previous visit (from the past 72 hour(s)). No results found.  Depression screen PHQ 2/9 01/16/2017  Decreased Interest  0  Down, Depressed, Hopeless 0  PHQ - 2 Score 0     Assessment and Plan: 36 y.o. female with   Establish care - reviewed PMH, PSH, PFH, medications and allergies - reviewed recent labs - negative PHQ2  1. Acquired hypothyroidism - cont Levothyroxine 50 mcg daily - TSH  2. Fatigue, unspecified type - CBC - Comprehensive metabolic panel - C-reactive protein - Ferritin - Sedimentation rate - TSH - Vitamin B12 - Vit D  25 hydroxy (rtn osteoporosis monitoring) - HIV antibody  3. Lymphadenopathy - no palpable lymph nodes on exam today - HIV antibody  4. Bradycardia with 51-60 beats per minute   5. Acute bilateral low back pain without sciatica - Meloxicam daily for 2 weeks, then daily as needed - Ice or heat x 20 minutes 3 times daily, whichever feels better - Foam roller - TENS unit (available on Amazon- TENS 7000 2nd ed) - Rehab exercises daily or formal physical therapy - meloxicam (MOBIC) 15 MG tablet; Take 1 tablet (15 mg total) by mouth daily.  Dispense: 30 tablet; Refill: 0 - Ambulatory referral to Physical Therapy - DG Lumbar Spine Complete; Future   Patient education and anticipatory guidance given Patient agrees with treatment plan Follow-up in 2 weeks or sooner as needed if symptoms worsen or fail to improve  Levonne Hubert PA-C

## 2017-01-17 LAB — CBC
HCT: 39.6 % (ref 35.0–45.0)
HEMOGLOBIN: 12.8 g/dL (ref 11.7–15.5)
MCH: 28.8 pg (ref 27.0–33.0)
MCHC: 32.3 g/dL (ref 32.0–36.0)
MCV: 89.2 fL (ref 80.0–100.0)
MPV: 10.3 fL (ref 7.5–12.5)
Platelets: 284 10*3/uL (ref 140–400)
RBC: 4.44 10*6/uL (ref 3.80–5.10)
RDW: 12.5 % (ref 11.0–15.0)
WBC: 4.9 10*3/uL (ref 3.8–10.8)

## 2017-01-17 LAB — COMPREHENSIVE METABOLIC PANEL
AG Ratio: 1.7 (calc) (ref 1.0–2.5)
ALBUMIN MSPROF: 4.4 g/dL (ref 3.6–5.1)
ALT: 14 U/L (ref 6–29)
AST: 16 U/L (ref 10–30)
Alkaline phosphatase (APISO): 30 U/L — ABNORMAL LOW (ref 33–115)
BUN: 13 mg/dL (ref 7–25)
CO2: 25 mmol/L (ref 20–32)
CREATININE: 0.72 mg/dL (ref 0.50–1.10)
Calcium: 9.2 mg/dL (ref 8.6–10.2)
Chloride: 106 mmol/L (ref 98–110)
GLOBULIN: 2.6 g/dL (ref 1.9–3.7)
Glucose, Bld: 80 mg/dL (ref 65–99)
Potassium: 4.6 mmol/L (ref 3.5–5.3)
Sodium: 139 mmol/L (ref 135–146)
Total Bilirubin: 0.4 mg/dL (ref 0.2–1.2)
Total Protein: 7 g/dL (ref 6.1–8.1)

## 2017-01-17 LAB — FERRITIN: Ferritin: 7 ng/mL — ABNORMAL LOW (ref 10–154)

## 2017-01-17 LAB — HIV ANTIBODY (ROUTINE TESTING W REFLEX): HIV: NONREACTIVE

## 2017-01-17 LAB — VITAMIN D 25 HYDROXY (VIT D DEFICIENCY, FRACTURES): VIT D 25 HYDROXY: 55 ng/mL (ref 30–100)

## 2017-01-17 LAB — VITAMIN B12: Vitamin B-12: 459 pg/mL (ref 200–1100)

## 2017-01-17 LAB — TSH: TSH: 0.73 m[IU]/L

## 2017-01-17 LAB — C-REACTIVE PROTEIN: CRP: 0.4 mg/L (ref <8.0)

## 2017-01-17 LAB — SEDIMENTATION RATE: Sed Rate: 9 mm/h (ref 0–20)

## 2017-01-20 ENCOUNTER — Encounter: Payer: Self-pay | Admitting: Physician Assistant

## 2017-01-20 DIAGNOSIS — R748 Abnormal levels of other serum enzymes: Secondary | ICD-10-CM | POA: Insufficient documentation

## 2017-01-20 DIAGNOSIS — R790 Abnormal level of blood mineral: Secondary | ICD-10-CM

## 2017-01-20 HISTORY — DX: Abnormal level of blood mineral: R79.0

## 2017-01-20 NOTE — Progress Notes (Signed)
Hi Brittney Saunders,  Your labs look good overall. - Your TSH is on the low end of normal, so I would not feel comfortable increasing your dose of Levothyroxine. Stay at 50 mcg. - Your iron stores are very low. I would recommend starting an iron supplement. Recommend taking ferrous sulfate twice a day with a glass of orange juice on Mondays, Wednesdays, and Fridays. Recommend we re-check your levels in 3 months.  Best, Vinetta Bergamo

## 2017-01-24 ENCOUNTER — Ambulatory Visit: Payer: BLUE CROSS/BLUE SHIELD | Admitting: Rehabilitative and Restorative Service Providers"

## 2017-01-30 ENCOUNTER — Encounter: Payer: Self-pay | Admitting: Physician Assistant

## 2017-01-30 ENCOUNTER — Ambulatory Visit (INDEPENDENT_AMBULATORY_CARE_PROVIDER_SITE_OTHER): Payer: BLUE CROSS/BLUE SHIELD | Admitting: Physician Assistant

## 2017-01-30 VITALS — BP 120/76 | HR 61 | Wt 140.0 lb

## 2017-01-30 DIAGNOSIS — M545 Low back pain, unspecified: Secondary | ICD-10-CM

## 2017-01-30 DIAGNOSIS — M542 Cervicalgia: Secondary | ICD-10-CM | POA: Diagnosis not present

## 2017-01-30 DIAGNOSIS — N924 Excessive bleeding in the premenopausal period: Secondary | ICD-10-CM | POA: Diagnosis not present

## 2017-01-30 NOTE — Progress Notes (Signed)
HPI:                                                                Brittney Saunders is a 36 y.o. female who presents to [facility]: Primary Care Sports Medicine today for follow-up of back pain  Back pain: patient states this is unchanged compared to 2 weeks ago. She has been taking Meloxicam. She was unable to schedule an appointment with PT.  Patient reports sudden onset neck pain on Saturday when she woke up. Noted decreased range of motion. Pain is moderate, persistent. Reports paresthesias in her hands bilaterally with activity such as blow drying hair. Denies known injury or trauma.  Patient also reports menstrual irregularities. Reports menses are 2 weeks apart, lasting 7-8 days. Denies heavy bleeding or dysmenorrhea. Pap smear performed 06/28/16 was NILM, HPV negative. Last CBC did not show anemia. Ferritin levels were low and iron supplementation was recommended.  Past Medical History:  Diagnosis Date  . Anemia   . Chest pain    a. ETT in 02/2016 showing no ischemic changes, echo with preserved EF of 60-65% with no WMA.  . Hyperlipidemia   . Hypothyroidism   . Low serum ferritin level 01/20/2017  . Lyme disease    Past Surgical History:  Procedure Laterality Date  . AUGMENTATION MAMMAPLASTY Bilateral 2014   Saline, retropectoral  . CESAREAN SECTION  2005, 2006, 2008  . HERNIA REPAIR    . TUBAL LIGATION     Social History  Substance Use Topics  . Smoking status: Never Smoker  . Smokeless tobacco: Never Used  . Alcohol use Yes     Comment: occassional   family history includes Aneurysm in her maternal grandmother; Breast cancer in her maternal aunt, maternal aunt, maternal grandmother, and paternal grandmother; Cancer - Ovarian in her maternal grandmother; Congestive Heart Failure in her maternal grandfather; Heart attack (age of onset: 74) in her maternal uncle; Other in her maternal aunt.  ROS: negative except as noted in the  HPI  Medications: Current Outpatient Prescriptions  Medication Sig Dispense Refill  . levothyroxine (SYNTHROID, LEVOTHROID) 50 MCG tablet TAKE 1 TABLET BY MOUTH EVERY DAY 30 tablet 2  . meloxicam (MOBIC) 15 MG tablet Take 1 tablet (15 mg total) by mouth daily. 30 tablet 0  . Methocarbamol (ROBAXIN PO) Take by mouth.     No current facility-administered medications for this visit.    Allergies  Allergen Reactions  . Prednisone Other (See Comments)    Doesn't help        Objective:  BP 120/76   Pulse 61   Wt 140 lb (63.5 kg)   BMI 25.61 kg/m  Gen:  alert, not ill-appearing, no distress, appropriate for age HEENT: head normocephalic without obvious abnormality, conjunctiva and cornea clear, trachea midline Pulm: Normal work of breathing, normal phonation, clear to auscultation bilaterally, no wheezes, rales or rhonchi CV: Normal rate, regular rhythm, s1 and s2 distinct, no murmurs, clicks or rubs  Neuro: alert and oriented x 3, no tremor MSK: Neck - atraumatic, no midline tenderness, pain with lateral bend and rotation, ROM limited; extremities atraumatic, normal gait and station Skin: intact, no rashes on exposed skin, no jaundice, no cyanosis Psych: well-groomed, cooperative, good eye contact, euthymic mood, affect mood-congruent,  speech is articulate, and thought processes clear and goal-directed    No results found for this or any previous visit (from the past 72 hour(s)). No results found.    Assessment and Plan: 36 y.o. female with   1. Acute bilateral low back pain without sciatica - recommend formal physical therapy - continue Meloxicam daily - Robaxin prn   2. Acute neck pain - atraumatic axial neck pain. Suspect acute cervical strain - anti-inflammatory daily - Ambulatory referral to Physical Therapy  3. Excessive bleeding in premenopausal period - patient mentioned symptoms at the end of the visit and there was not adequate time to perform a pelvic  exam. Recommend she return in 2 weeks for follow-up and a pelvic - recommend [place] to rule out endometrial abnormality, including leiomyoma and hyperplasia - iron supplementation - US PELVIS (TRANSABDOMINAL ONLY); Future - US PELVIS TRANSVANGINAL NON-OB (TV ONLY); Future - Follicle stimulating hormone - Luteinizing hormone - Prolactin  Patient education and anticipatory guidance given Patient agrees with treatment plan Follow-up in 2 weeks for pelvic exam or sooner as needed if symptoms worsen or fail to improve  Brittney Hubert PA-C

## 2017-01-30 NOTE — Patient Instructions (Signed)
Abnormal Uterine Bleeding Abnormal uterine bleeding can affect women at various stages in life, including teenagers, women in their reproductive years, pregnant women, and women who have reached menopause. Several kinds of uterine bleeding are considered abnormal, including:  Bleeding or spotting between periods.  Bleeding after sexual intercourse.  Bleeding that is heavier or more than normal.  Periods that last longer than usual.  Bleeding after menopause. Many cases of abnormal uterine bleeding are minor and simple to treat, while others are more serious. Any type of abnormal bleeding should be evaluated by your health care provider. Treatment will depend on the cause of the bleeding. Follow these instructions at home: Monitor your condition for any changes. The following actions may help to alleviate any discomfort you are experiencing:  Avoid the use of tampons and douches as directed by your health care provider.  Change your pads frequently. You should get regular pelvic exams and Pap tests. Keep all follow-up appointments for diagnostic tests as directed by your health care provider. Contact a health care provider if:  Your bleeding lasts more than 1 week.  You feel dizzy at times. Get help right away if:  You pass out.  You are changing pads every 15 to 30 minutes.  You have abdominal pain.  You have a fever.  You become sweaty or weak.  You are passing large blood clots from the vagina.  You start to feel nauseous and vomit. This information is not intended to replace advice given to you by your health care provider. Make sure you discuss any questions you have with your health care provider. Document Released: 04/22/2005 Document Revised: 10/04/2015 Document Reviewed: 11/19/2012 Elsevier Interactive Patient Education  2017 Elsevier Inc.  

## 2017-01-31 ENCOUNTER — Ambulatory Visit (INDEPENDENT_AMBULATORY_CARE_PROVIDER_SITE_OTHER): Payer: BLUE CROSS/BLUE SHIELD

## 2017-01-31 DIAGNOSIS — N926 Irregular menstruation, unspecified: Secondary | ICD-10-CM

## 2017-01-31 DIAGNOSIS — N924 Excessive bleeding in the premenopausal period: Secondary | ICD-10-CM

## 2017-01-31 LAB — FOLLICLE STIMULATING HORMONE: FSH: 7.2 m[IU]/mL

## 2017-01-31 LAB — PROLACTIN: PROLACTIN: 5.3 ng/mL

## 2017-01-31 LAB — LUTEINIZING HORMONE: LH: 3.9 m[IU]/mL

## 2017-01-31 NOTE — Progress Notes (Signed)
Good morning,   Hormone levels are normal Plan does not change Recommend pelvic/transvaginal ultrasound. You should be contacted to schedule this [phone]  Santa Lighter

## 2017-01-31 NOTE — Progress Notes (Signed)
Hi Brittney Saunders,  Your ultrasound was normal. There are no fibroids or abnormalities of your uterus.   The treatment options are birth control pills, patch, ring or Mirena IUD. Think about these options and let me know what you decide.  Best, Vinetta Bergamo

## 2017-02-03 DIAGNOSIS — N924 Excessive bleeding in the premenopausal period: Secondary | ICD-10-CM | POA: Insufficient documentation

## 2017-02-06 ENCOUNTER — Encounter: Payer: Self-pay | Admitting: Rehabilitative and Restorative Service Providers"

## 2017-02-06 ENCOUNTER — Ambulatory Visit (INDEPENDENT_AMBULATORY_CARE_PROVIDER_SITE_OTHER): Payer: BLUE CROSS/BLUE SHIELD | Admitting: Rehabilitative and Restorative Service Providers"

## 2017-02-06 DIAGNOSIS — R29898 Other symptoms and signs involving the musculoskeletal system: Secondary | ICD-10-CM

## 2017-02-06 DIAGNOSIS — R293 Abnormal posture: Secondary | ICD-10-CM | POA: Diagnosis not present

## 2017-02-06 DIAGNOSIS — M542 Cervicalgia: Secondary | ICD-10-CM | POA: Diagnosis not present

## 2017-02-06 DIAGNOSIS — M545 Low back pain: Secondary | ICD-10-CM | POA: Diagnosis not present

## 2017-02-06 DIAGNOSIS — G8929 Other chronic pain: Secondary | ICD-10-CM | POA: Diagnosis not present

## 2017-02-06 NOTE — Patient Instructions (Signed)
Axial Extension (Chin Tuck)    Pull chin in and lengthen back of neck. Hold __5-10__ seconds while counting out loud. Repeat _5___ times. Do _several___ sessions per day. Lying down and sitting - can to this in standing and while driving   Abdominal Bracing With Pelvic Floor (Hook-Lying)    With neutral spine, tighten pelvic floor and abdominals sucking belly button to back bone; tighten muscles in back at waist. Exhale. Hold 10 sec  Repeat _10__ times. Do __several _ times a day. Progress to do this in sitting; standing; walking and with functional activities    Scapula Adduction With Pectoralis Stretch: Low - Standing   Shoulders at 45 hands even with shoulders, keeping weight through legs, shift weight forward until you feel pull or stretch through the front of your chest. Hold _30__ seconds. Do _3__ times, _2-4__ times per day.   Scapula Adduction With Pectoralis Stretch: Mid-Range - Standing   Shoulders at 90 elbows even with shoulders, keeping weight through legs, shift weight forward until you feel pull or strength through the front of your chest. Hold __30_ seconds. Do _3__ times, __2-4_ times per day.   Scapula Adduction With Pectoralis Stretch: High - Standing   Shoulders at 120 hands up high on the doorway, keeping weight on feet, shift weight forward until you feel pull or stretch through the front of your chest. Hold _30__ seconds. Do _3__ times, _2-3__ times per day.  Axial Extension (Chin Tuck)    Pull chin in and lengthen back of neck. Hold __5__ seconds while counting out loud. Repeat __10__ times. Do __several__ sessions per day.  Shoulder Blade Squeeze    Rotate shoulders back, then squeeze shoulder blades together. Repeat _10___ times. Do _several ___ sessions per day.  Upper Back Strength: Lower Trapezius / Rotator Cuff " L's "     Arms in waitress pose, palms up. Press hands back and slide shoulder blades down. Hold for __5__ seconds.  Repeat _10___ times. 1-2 times per day.    Scapular Retraction: Elbow Flexion (Standing)  "W's"     With elbows bent to 90, pinch shoulder blades together and rotate arms out, keeping elbows bent. Repeat __10__ times per set. Do __1-2__ sets per session. Do _several ___ sessions per day.   [facility] [address] [place], [place] [address]  [phone] (office) [phone] (fax)  TENS UNIT: This is helpful for muscle pain and spasm.   Search and Purchase a TENS 7000 2nd edition at www.tenspros.com. It should be less than $30.     TENS unit instructions: Do not shower or bathe with the unit on Turn the unit off before removing electrodes or batteries If the electrodes lose stickiness add a drop of water to the electrodes after they are disconnected from the unit and place on plastic sheet. If you continued to have difficulty, call the TENS unit company to purchase more electrodes. Do not apply lotion on the skin area prior to use. Make sure the skin is clean and dry as this will help prolong the life of the electrodes. After use, always check skin for unusual red areas, rash or other skin difficulties. If there are any skin problems, does not apply electrodes to the same area. Never remove the electrodes from the unit by pulling the wires. Do not use the TENS unit or electrodes other than as directed. Do not change electrode placement without consultating your therapist or physician. Keep 2  fingers with between each electrode.   Sleeping on Back  Place pillow under knees. A pillow with cervical support and a roll around waist are also helpful. Copyright  VHI. All rights reserved.  Sleeping on Side Place pillow between knees. Use cervical support under neck and a roll around waist as needed. Copyright  VHI. All rights reserved.   Sleeping on Stomach   If this is the only desirable sleeping position, place pillow  under lower legs, and under stomach or chest as needed.  Posture - Sitting   Sit upright, head facing forward. Try using a roll to support lower back. Keep shoulders relaxed, and avoid rounded back. Keep hips level with knees. Avoid crossing legs for long periods. Stand to Sit / Sit to Stand   To sit: Bend knees to lower self onto front edge of chair, then scoot back on seat. To stand: Reverse sequence by placing one foot forward, and scoot to front of seat. Use rocking motion to stand up.   Work Height and Reach  Ideal work height is no more than 2 to 4 inches below elbow level when standing, and at elbow level when sitting. Reaching should be limited to arm's length, with elbows slightly bent.  Bending  Bend at hips and knees, not back. Keep feet shoulder-width apart.    Posture - Standing   Good posture is important. Avoid slouching and forward head thrust. Maintain curve in low back and align ears over shoul- ders, hips over ankles.  Alternating Positions   Alternate tasks and change positions frequently to reduce fatigue and muscle tension. Take rest breaks. Computer Work   Position work to Art gallery manager. Use proper work and seat height. Keep shoulders back and down, wrists straight, and elbows at right angles. Use chair that provides full back support. Add footrest and lumbar roll as needed.  Getting Into / Out of Car  Lower self onto seat, scoot back, then bring in one leg at a time. Reverse sequence to get out.  Dressing  Lie on back to pull socks or slacks over feet, or sit and bend leg while keeping back straight.    Housework - Sink  Place one foot on ledge of cabinet under sink when standing at sink for prolonged periods.   Pushing / Pulling  Pushing is preferable to pulling. Keep back in proper alignment, and use leg muscles to do the work.  Deep Squat   Squat and lift with both arms held against upper trunk. Tighten stomach muscles without holding  breath. Use smooth movements to avoid jerking.  Avoid Twisting   Avoid twisting or bending back. Pivot around using foot movements, and bend at knees if needed when reaching for articles.  Carrying Luggage   Distribute weight evenly on both sides. Use a cart whenever possible. Do not twist trunk. Move body as a unit.   Lifting Principles .Maintain proper posture and head alignment. .Slide object as close as possible before lifting. .Move obstacles out of the way. .Test before lifting; ask for help if too heavy. .Tighten stomach muscles without holding breath. .Use smooth movements; do not jerk. .Use legs to do the work, and pivot with feet. .Distribute the work load symmetrically and close to the center of trunk. .Push instead of pull whenever possible.   Ask For Help   Ask for help and delegate to others when possible. Coordinate your movements when lifting together, and maintain the low back curve.  Log Roll   Lying  on back, bend left knee and place left arm across chest. Roll all in one movement to the right. Reverse to roll to the left. Always move as one unit. Housework - Sweeping  Use long-handled equipment to avoid stooping.   Housework - Wiping  Position yourself as close as possible to reach work surface. Avoid straining your back.  Laundry - Unloading Wash   To unload small items at bottom of washer, lift leg opposite to arm being used to reach.  Gardening - Raking  Move close to area to be raked. Use arm movements to do the work. Keep back straight and avoid twisting.     Cart  When reaching into cart with one arm, lift opposite leg to keep back straight.   Getting Into / Out of Bed  Lower self to lie down on one side by raising legs and lowering head at the same time. Use arms to assist moving without twisting. Bend both knees to roll onto back if desired. To sit up, start from lying on side, and use same move-ments in reverse. Housework -  Vacuuming  Hold the vacuum with arm held at side. Step back and forth to move it, keeping head up. Avoid twisting.   Laundry - Armed forces training and education officer so that bending and twisting can be avoided.   Laundry - Unloading Dryer  Squat down to reach into clothes dryer or use a reacher.  Gardening - Weeding / Psychiatric nurse or Kneel. Knee pads may be helpful.
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
## 2017-02-12 ENCOUNTER — Other Ambulatory Visit: Payer: Self-pay | Admitting: Physician Assistant

## 2017-02-12 DIAGNOSIS — M545 Low back pain, unspecified: Secondary | ICD-10-CM

## 2017-02-13 ENCOUNTER — Encounter: Payer: BLUE CROSS/BLUE SHIELD | Admitting: Physical Therapy

## 2017-02-13 ENCOUNTER — Ambulatory Visit: Payer: BLUE CROSS/BLUE SHIELD | Admitting: Physician Assistant

## 2017-02-13 DIAGNOSIS — Z0189 Encounter for other specified special examinations: Secondary | ICD-10-CM

## 2017-02-18 ENCOUNTER — Ambulatory Visit: Payer: BLUE CROSS/BLUE SHIELD | Admitting: Cardiovascular Disease

## 2017-02-20 ENCOUNTER — Encounter: Payer: Self-pay | Admitting: *Deleted

## 2017-04-14 ENCOUNTER — Other Ambulatory Visit: Payer: Self-pay | Admitting: Student

## 2017-06-24 ENCOUNTER — Other Ambulatory Visit: Payer: Self-pay | Admitting: Cardiovascular Disease

## 2017-06-24 NOTE — Telephone Encounter (Signed)
°*  STAT* If patient is at the pharmacy, call can be transferred to refill team.   1. Which medications need to be refilled? (please list name of each medication and dose if known)  A new prescription fr her Levothyroxine  2.Which pharmacy/location (including street and city if local pharmacy) is medication to be sent to?[facility] [phone]  3. Do they need a 30 day or 90 day supply? 60 and refills

## 2017-06-29 NOTE — Telephone Encounter (Signed)
Okay to renew, but who originally prescribed this medication and is she still seeing that individual?

## 2017-06-30 ENCOUNTER — Other Ambulatory Visit: Payer: Self-pay | Admitting: Cardiovascular Disease

## 2017-07-01 MED ORDER — LEVOTHYROXINE SODIUM 50 MCG PO TABS: 50.0000 ug | ORAL_TABLET | Freq: Every day | ORAL | 0 refills | Status: DC

## 2017-07-01 NOTE — Telephone Encounter (Signed)
Rx(s) sent to pharmacy electronically.  

## 2017-07-01 NOTE — Addendum Note (Signed)
Addended by: Lindell SparELKINS, JENNA M on: 07/01/2017 03:53 PM   Modules accepted: Orders

## 2017-07-01 NOTE — Telephone Encounter (Signed)
Attempted to call patient x2 and phone did not ring. Patient has not seen cardiology provider in >1 year. Message sent to patient in MyChart advising her to contact PCP or endocrinologist for refills and make appointment with cardiology

## 2017-07-19 IMAGING — US US PELVIS COMPLETE
1 series · 15 of 25 positions shown · non-contrast
Comparison: None

CLINICAL DATA: Pelvic pain, irregular cycles



[Series 1: us pelvis complete · 15 of 77 slices shown]
[im 1/77]
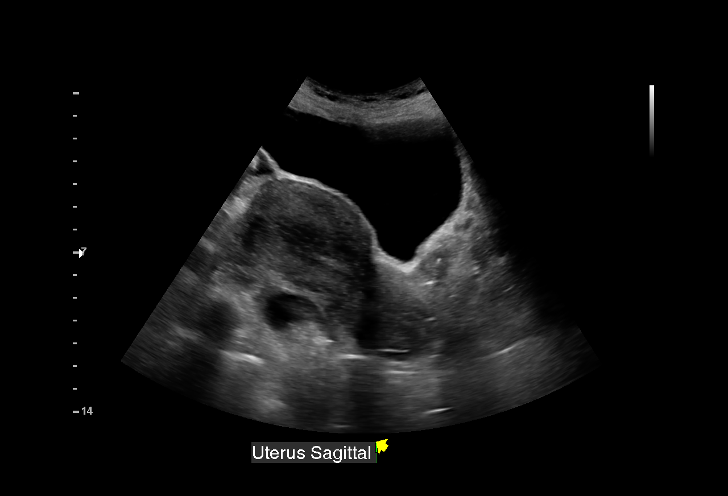
[im 7/77]
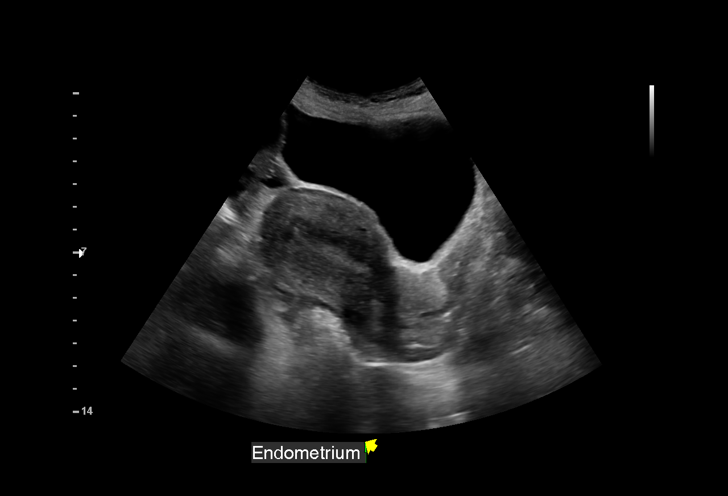
[im 13/77]
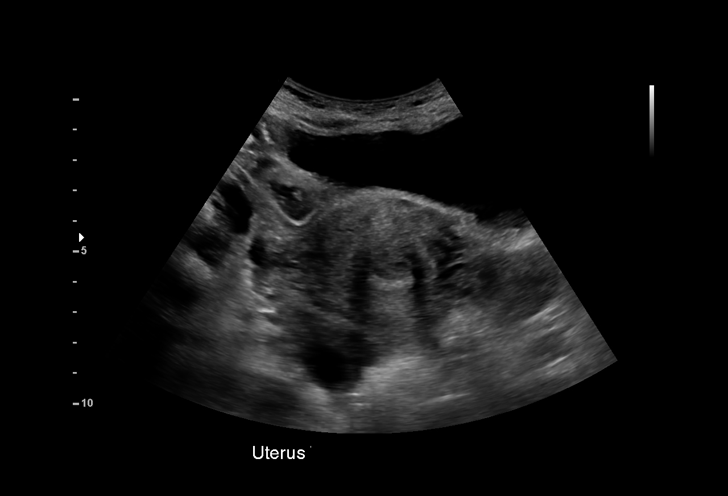
[im 16/77]
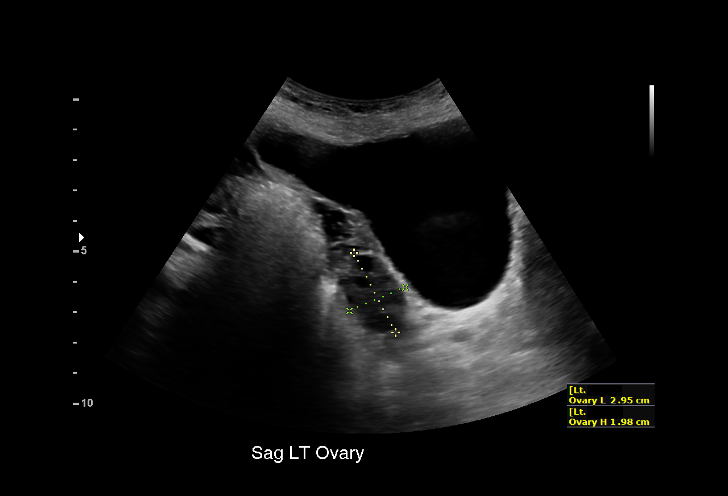
[im 23/77]
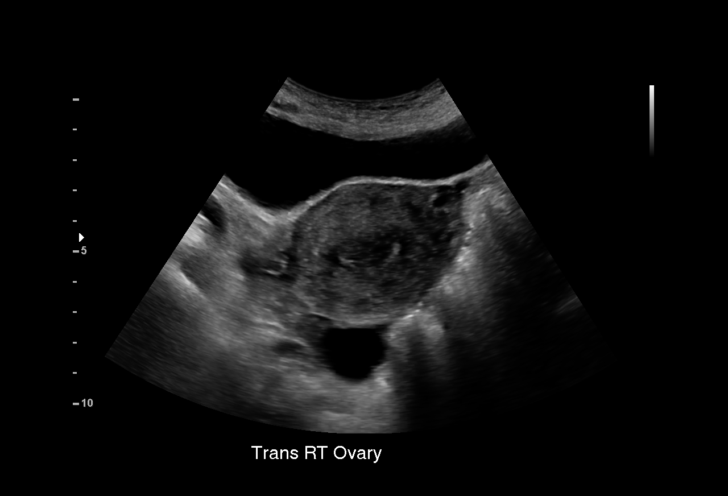
[im 29/77]
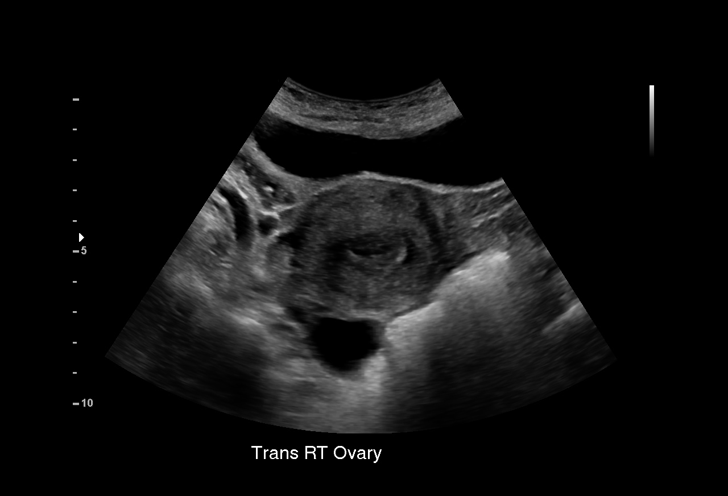
[im 32/77]
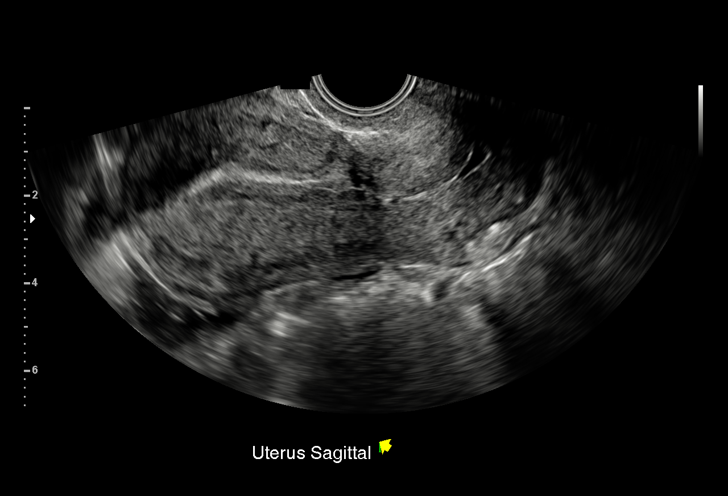
[im 39/77]
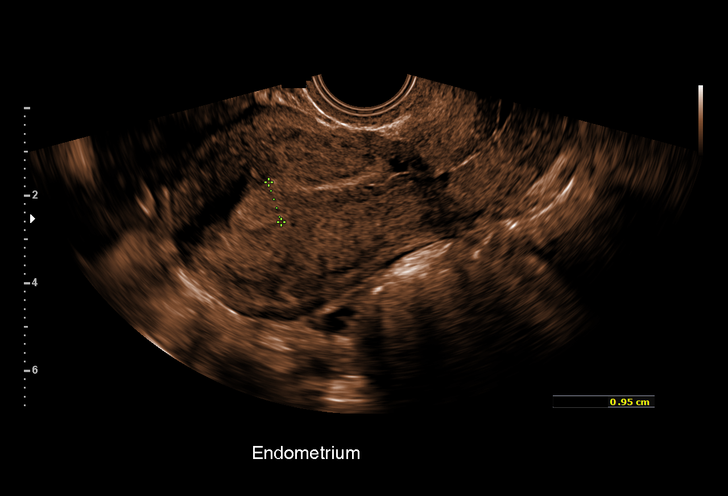
[im 45/77]
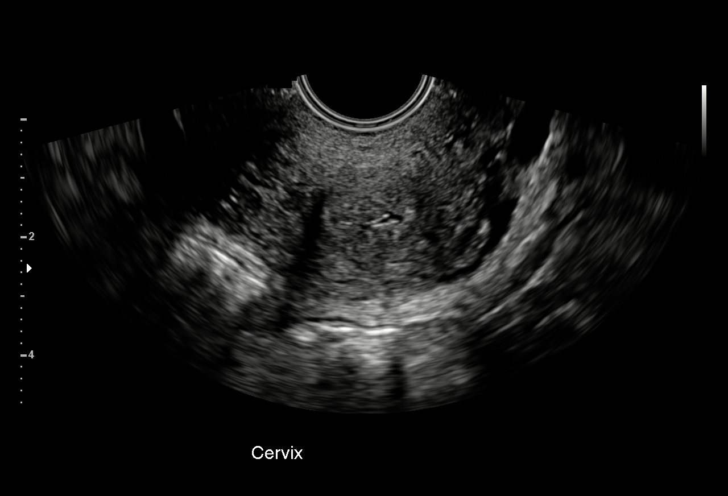
[im 48/77]
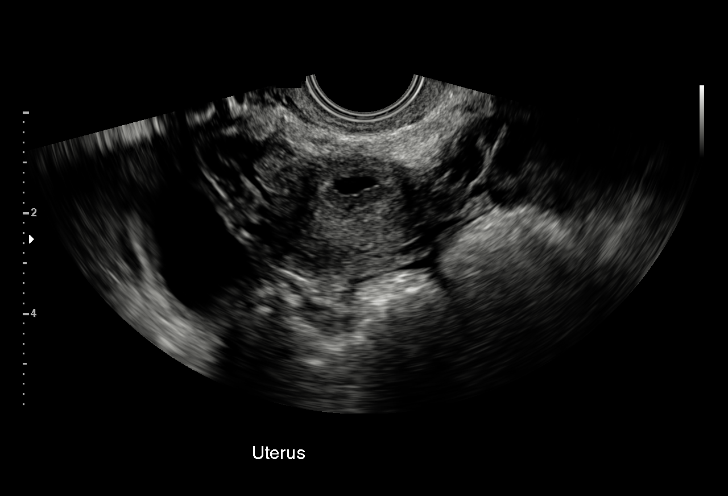
[im 54/77]
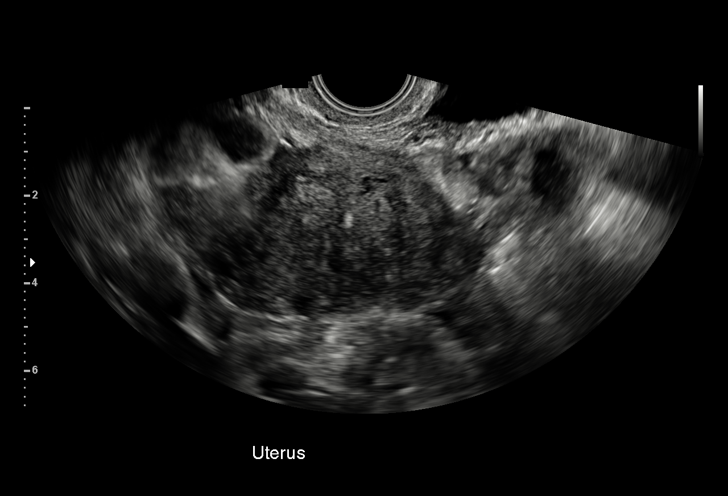
[im 61/77]
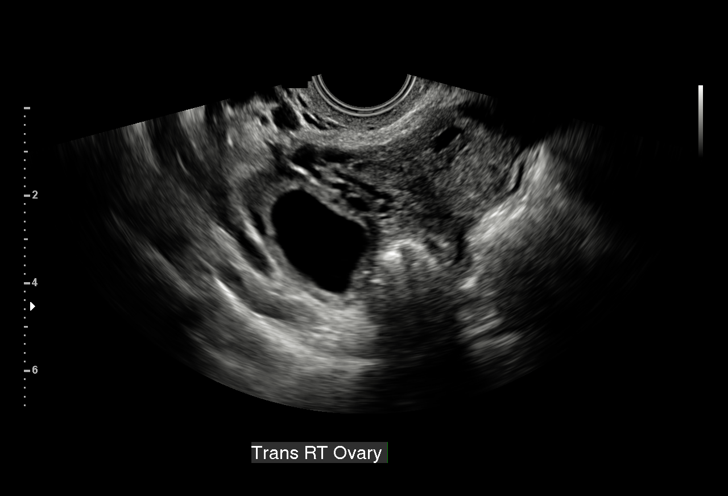
[im 64/77]
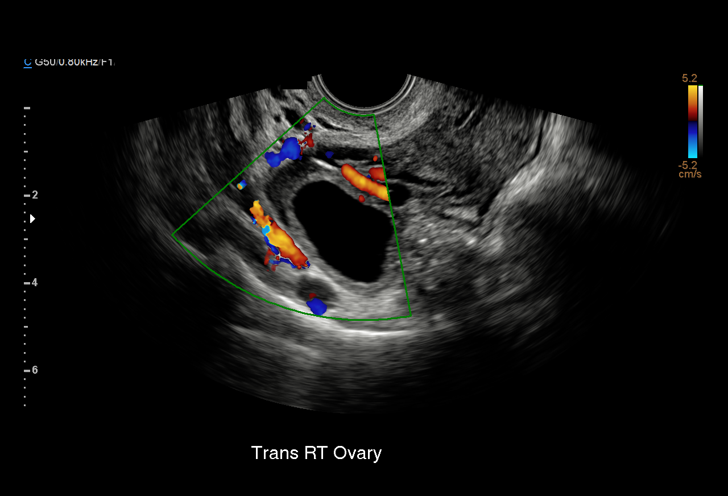
[im 70/77]
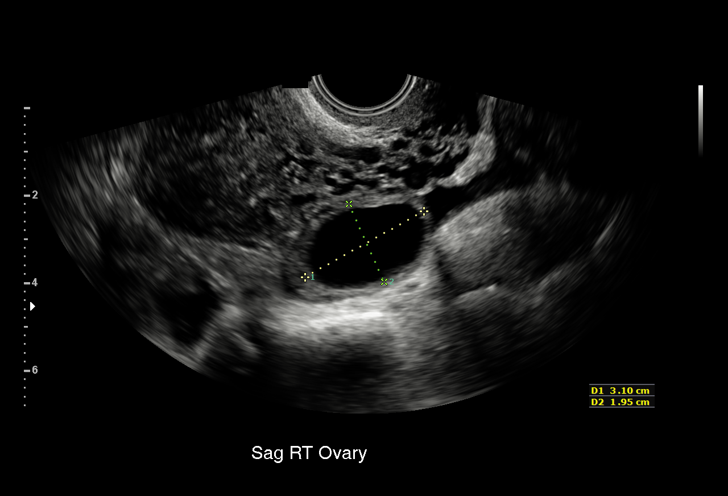
[im 77/77]
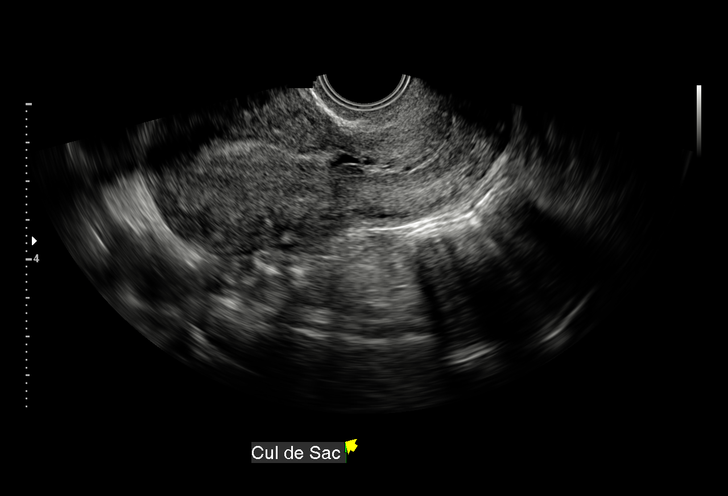

[15 of 25 positions shown; findings below may reference images not displayed]

FINDINGS: Uterus

Measurements: 9.8 x 4.7 x 5.7 cm. No fibroids or other mass
visualized.

Endometrium

Thickness: 10 mm in thickness.  No focal abnormality visualized.

Right ovary

Measurements: 3.8 x 2.3 x 2.6 cm. 3.1 cm simple appearing cyst.

Left ovary

Measurements: 3.5 x 2.8 x 2.2 cm. Normal appearance/no adnexal mass.

Other findings

No abnormal free fluid.
IMPRESSION: 3.1 cm simple right ovarian cyst.

Otherwise unremarkable pelvic ultrasound.

## 2017-07-29 ENCOUNTER — Other Ambulatory Visit: Payer: Self-pay | Admitting: Cardiovascular Disease

## 2017-07-29 ENCOUNTER — Other Ambulatory Visit: Payer: Self-pay

## 2017-08-12 ENCOUNTER — Other Ambulatory Visit: Payer: Self-pay | Admitting: Cardiovascular Disease

## 2018-07-22 ENCOUNTER — Other Ambulatory Visit: Payer: Self-pay

## 2018-07-28 LAB — NOVEL CORONAVIRUS, NAA: SARS-CoV-2, NAA: NOT DETECTED

## 2018-11-18 ENCOUNTER — Telehealth: Payer: Self-pay | Admitting: Cardiovascular Disease

## 2018-11-18 NOTE — Telephone Encounter (Signed)
lm 11-18-18 for recall °

## 2020-02-07 ENCOUNTER — Ambulatory Visit (INDEPENDENT_AMBULATORY_CARE_PROVIDER_SITE_OTHER): Payer: Medicaid Other | Admitting: Podiatry

## 2020-02-07 ENCOUNTER — Other Ambulatory Visit: Payer: Self-pay

## 2020-02-07 ENCOUNTER — Ambulatory Visit (INDEPENDENT_AMBULATORY_CARE_PROVIDER_SITE_OTHER): Payer: Medicaid Other

## 2020-02-07 DIAGNOSIS — M21611 Bunion of right foot: Secondary | ICD-10-CM | POA: Diagnosis not present

## 2020-02-07 DIAGNOSIS — M21612 Bunion of left foot: Secondary | ICD-10-CM | POA: Diagnosis not present

## 2020-02-07 DIAGNOSIS — M21619 Bunion of unspecified foot: Secondary | ICD-10-CM

## 2020-02-07 DIAGNOSIS — M79671 Pain in right foot: Secondary | ICD-10-CM

## 2020-02-07 DIAGNOSIS — M21961 Unspecified acquired deformity of right lower leg: Secondary | ICD-10-CM | POA: Diagnosis not present

## 2020-02-07 DIAGNOSIS — M2041 Other hammer toe(s) (acquired), right foot: Secondary | ICD-10-CM | POA: Diagnosis not present

## 2020-02-07 DIAGNOSIS — G8929 Other chronic pain: Secondary | ICD-10-CM

## 2020-02-07 NOTE — Patient Instructions (Signed)

## 2020-02-10 NOTE — Progress Notes (Signed)
Subjective:   Patient ID: Brittney Saunders, female   DOB: 39 y.o.   MRN: [id number]   HPI 39 year old female presents the office today with concerns of bilateral foot pain, bunion deformities with the right side worse than left.  Also the right side she is redness her second toe starting to contract as well.  She is tried shoe modifications, padding the insignificant provement she states that she has pain on a daily basis and she wants to discuss surgical options at this time.  She has no other concerns today.   Review of Systems  All other systems reviewed and are negative.  Past Medical History:  Diagnosis Date  . Anemia   . Chest pain    a. ETT in 02/2016 showing no ischemic changes, echo with preserved EF of 60-65% with no WMA.  . Hyperlipidemia   . Hypothyroidism   . Low serum ferritin level 01/20/2017  . Lyme disease     Past Surgical History:  Procedure Laterality Date  . AUGMENTATION MAMMAPLASTY Bilateral 2014   Saline, retropectoral  . CESAREAN SECTION  2005, 2006, 2008  . HERNIA REPAIR    . TUBAL LIGATION       Current Outpatient Medications:  .  escitalopram (LEXAPRO) 5 MG tablet, Take 5 mg by mouth daily., Disp: , Rfl:  .  levothyroxine (SYNTHROID, LEVOTHROID) 50 MCG tablet, TAKE 1 TABLET BY MOUTH EVERY DAY **NEED OV WITH PCP & CARDIOLOGY**, Disp: 30 tablet, Rfl: 0 .  meloxicam (MOBIC) 15 MG tablet, TAKE 1 TABLET BY MOUTH EVERY DAY, Disp: 30 tablet, Rfl: 0 .  Methocarbamol (ROBAXIN PO), Take by mouth., Disp: , Rfl:  .  rizatriptan (MAXALT-MLT) 10 MG disintegrating tablet, SMARTSIG:1 Tablet(s) By Mouth 1 to 2 Times Daily, Disp: , Rfl:  .  valACYclovir (VALTREX) 500 MG tablet, Take 500 mg by mouth 2 (two) times daily., Disp: , Rfl:  .  venlafaxine XR (EFFEXOR-XR) 75 MG 24 hr capsule, Take 75 mg by mouth daily., Disp: , Rfl:   Allergies  Allergen Reactions  . Prednisone Other (See Comments)    Doesn't help           Objective:  Physical Exam  General: AAO  x3, NAD  Dermatological: Skin is warm, dry and supple bilateral. There are no open sores, no preulcerative lesions, no rash or signs of infection present.  Vascular: Dorsalis Pedis artery and Posterior Tibial artery pedal pulses are 2/4 bilateral with immedate capillary fill time.  There is no pain with calf compression, swelling, warmth, erythema.   Neruologic: Grossly intact via light touch bilateral.   Musculoskeletal: Severe bunion deformities present on the right side worse than left there is tenderness on the first metatarsal head medially.  Hypermobility is present the first ray.  Hammertoe contracture present to right second toe.  Mild erythema on the dorsal PIPJ from irritation inside shoes on the second digit.  Muscular strength 5/5 in all groups tested bilateral.  Gait: Unassisted, Nonantalgic.       Assessment:   Severe bunion deformity right side worse than left with right second digit hammertoe     Plan:  -Treatment options discussed including all alternatives, risks, and complications -Etiology of symptoms were discussed -X-rays obtained reviewed.  Severe bunion deformities present with hammertoe contracture of the second digit and elongated second metatarsal on the right side.  To the right and the left side.  No evidence of acute fracture bilaterally. -We discussed with conservative as well as surgical treatment options.  This time she was to proceed with surgical intervention.  We discussed the right foot Lapidus bunionectomy with 6 metatarsal shortening osteotomy/hammertoe repair.  She was to proceed with this. -The incision placement as well as the postoperative course was discussed with the patient. I discussed risks of the surgery which include, but not limited to, infection, bleeding, pain, swelling, need for further surgery, delayed or nonhealing, painful or ugly scar, numbness or sensation changes, over/under correction, recurrence, transfer lesions, further  deformity, hardware failure, DVT/PE, loss of toe/foot. Patient understands these risks and wishes to proceed with surgery. The surgical consent was reviewed with the patient all 3 pages were signed. No promises or guarantees were given to the outcome of the procedure. All questions were answered to the best of my ability. Before the surgery the patient was encouraged to call the office if there is any further questions. The surgery will be performed at the [facility] on an outpatient basis. -CAM boot dispensed for postoperative use  Vivi Barrack DPM

## 2020-03-09 ENCOUNTER — Telehealth: Payer: Self-pay

## 2020-03-09 NOTE — Telephone Encounter (Signed)
DOS 03/15/2020  LAPIDUS PROC INC. BUNIONECTOMY RT - [id number] METATARSAL OSTEOTOMY 2ND RT - 28308 HAMMERTOE REPAIR 2ND RT - 28285  RECEIVED CALL FROM JENNA WITH [facility]. SHE STATED NO PRECERT REQUIRED FOR CPT A265085, [id number] OR [id number]. REF # [id number]

## 2020-03-15 ENCOUNTER — Other Ambulatory Visit: Payer: Self-pay | Admitting: Podiatry

## 2020-03-15 ENCOUNTER — Encounter: Payer: Self-pay | Admitting: Podiatry

## 2020-03-15 DIAGNOSIS — M21541 Acquired clubfoot, right foot: Secondary | ICD-10-CM

## 2020-03-15 DIAGNOSIS — M2011 Hallux valgus (acquired), right foot: Secondary | ICD-10-CM

## 2020-03-15 DIAGNOSIS — M2041 Other hammer toe(s) (acquired), right foot: Secondary | ICD-10-CM

## 2020-03-15 MED ORDER — OXYCODONE-ACETAMINOPHEN 5-325 MG PO TABS: 1.0000 | ORAL_TABLET | Freq: Four times a day (QID) | ORAL | 0 refills | Status: DC | PRN

## 2020-03-15 MED ORDER — CEPHALEXIN 500 MG PO CAPS: 500.0000 mg | ORAL_CAPSULE | Freq: Three times a day (TID) | ORAL | 0 refills | Status: DC

## 2020-03-15 MED ORDER — PROMETHAZINE HCL 25 MG PO TABS: 25.0000 mg | ORAL_TABLET | Freq: Three times a day (TID) | ORAL | 0 refills | Status: DC | PRN

## 2020-03-15 NOTE — Progress Notes (Signed)
Post-op medication sent 

## 2020-03-16 ENCOUNTER — Telehealth: Payer: Self-pay | Admitting: *Deleted

## 2020-03-16 ENCOUNTER — Other Ambulatory Visit: Payer: Self-pay | Admitting: Podiatry

## 2020-03-16 MED ORDER — OXYCODONE-ACETAMINOPHEN 10-325 MG PO TABS
1.0000 | ORAL_TABLET | ORAL | 0 refills | Status: DC | PRN
Start: 2020-03-16 — End: 2022-04-11

## 2020-03-16 NOTE — Telephone Encounter (Signed)
Hadley Pen, CMA called the patient back. I have increased her Percocet to 10/325. Encouraged to ice/elevate- I can see her if needed prior to the scheduled appointment

## 2020-03-16 NOTE — Telephone Encounter (Signed)
Called and spoke with the patient today and stated that I was calling to see how the patient was doing after surgery with Dr Ardelle Anton on Wednesday 03/15/2020 and patient stated that she was in a lot of pain but was doing good and patient stated that she did take 2 tablets of the pain medicine today at 6 am and patient stated that the pain medicine was not touching the pain and I stated to do ibuprofen in between and patient understood and patient could wiggle her toes and there was not any fever or chills and no nausea and I stated to use the ice pack under the surgery knee to help with the swelling and patient agreed to doing that and patient stated that the bandage felt tight and I stated that she could take the boot off as long as patient was resting and or laying down and when she got up had to put the boot back on and to un wrap the ace bandage and re wrap more lightly and that should help with the pain and I stated to call me if any concerns or questions and that I would be in another office today and or could go in my chart and leave Dr Ardelle Anton a message. Misty Stanley

## 2020-03-16 NOTE — Telephone Encounter (Signed)
Patient called again and said the alternating of the pain medication is not even touching the pain. She spoke to another nurse and said they were concerned and said she might need to come in and she wanted to know what she should do.

## 2020-03-17 ENCOUNTER — Telehealth: Payer: Self-pay | Admitting: *Deleted

## 2020-03-17 NOTE — Telephone Encounter (Signed)
Patient called back yesterday and stated that she was in so much pain that the pain medicine was not touching it and per Dr Ardelle Anton sent over a stronger dose of pain medicine and I called and relayed the message per Dr Ardelle Anton and I stated to the patient to call if any concerns or questions and I called the patient today and patient stated that she was doing so much better after getting the pain medicine and I stated to call if any concerns or questions. Misty Stanley

## 2020-03-20 ENCOUNTER — Ambulatory Visit (INDEPENDENT_AMBULATORY_CARE_PROVIDER_SITE_OTHER): Payer: Medicaid Other

## 2020-03-20 ENCOUNTER — Other Ambulatory Visit: Payer: Self-pay

## 2020-03-20 ENCOUNTER — Ambulatory Visit (INDEPENDENT_AMBULATORY_CARE_PROVIDER_SITE_OTHER): Payer: Medicaid Other | Admitting: Podiatry

## 2020-03-20 DIAGNOSIS — M21619 Bunion of unspecified foot: Secondary | ICD-10-CM

## 2020-03-20 DIAGNOSIS — M21961 Unspecified acquired deformity of right lower leg: Secondary | ICD-10-CM | POA: Diagnosis not present

## 2020-03-20 DIAGNOSIS — M2041 Other hammer toe(s) (acquired), right foot: Secondary | ICD-10-CM

## 2020-03-28 ENCOUNTER — Ambulatory Visit (INDEPENDENT_AMBULATORY_CARE_PROVIDER_SITE_OTHER): Payer: Medicaid Other | Admitting: Podiatry

## 2020-03-28 ENCOUNTER — Other Ambulatory Visit: Payer: Self-pay

## 2020-03-28 DIAGNOSIS — M21619 Bunion of unspecified foot: Secondary | ICD-10-CM

## 2020-03-28 DIAGNOSIS — M2041 Other hammer toe(s) (acquired), right foot: Secondary | ICD-10-CM

## 2020-03-28 DIAGNOSIS — M21961 Unspecified acquired deformity of right lower leg: Secondary | ICD-10-CM

## 2020-03-29 NOTE — Progress Notes (Signed)
Subjective: Brittney Saunders is a 39 y.o. is seen today in office s/p RIGHT foot Lapidus bunionectomy and 2nd metatarsal osteotomy/hammertoe repair preformed on 03/15/2020.  States that she had pain the first couple days after surgery but this is improved.  She feels much better today.  She has been nonweightbearing in the cam boot.  Denies any systemic complaints such as fevers, chills, nausea, vomiting. No calf pain, chest pain, shortness of breath.   Objective: General: No acute distress, AAOx3  DP/PT pulses palpable 2/4, CRT < 3 sec to all digits.  Protective sensation intact. Motor function intact.  Right foot: Incision is well coapted without any evidence of dehiscence with sutures intact. There is no surrounding erythema, ascending cellulitis, fluctuance, crepitus, malodor, drainage/purulence. There is mild edema around the surgical site. There is mild pain along the surgical site.  K wire intact to the second toe without any signs of infection.  The toes are in rectus position. No other areas of tenderness to bilateral lower extremities.  No other open lesions or pre-ulcerative lesions.  No pain with calf compression, swelling, warmth, erythema.   Assessment and Plan:  Status post right foot surgery, doing well with no complications   -Treatment options discussed including all alternatives, risks, and complications -X-rays obtained and reviewed.  Hardware intact without any complicating factors.  There is no evidence of acute fracture. -Antibiotic ointment was applied followed by dressing.  Keep the dressing clean, dry, intact -Ice/elevation -Pain medication as needed. -Monitor for any clinical signs or symptoms of infection and DVT/PE and directed to call the office immediately should any occur or go to the ER. -Follow-up as scheduled for possible suture removal or sooner if any problems arise. In the meantime, encouraged to call the office with any questions, concerns, change in  symptoms.   Ovid Curd, DPM

## 2020-04-03 NOTE — Progress Notes (Signed)
Subjective: Brittney Saunders is a 39 y.o. is seen today in office s/p RIGHT foot Lapidus bunionectomy and 2nd metatarsal osteotomy/hammertoe repair preformed on 03/15/2020.  Overall doing well did not take any pain medication.  She has been nonweightbearing in a cam boot.  No recent injury or falls or changes since I last saw her no new concerns today. Denies any systemic complaints such as fevers, chills, nausea, vomiting. No calf pain, chest pain, shortness of breath.   Objective: General: No acute distress, AAOx3  DP/PT pulses palpable 2/4, CRT < 3 sec to all digits.  Protective sensation intact. Motor function intact.  Right foot: Incision is well coapted without any evidence of dehiscence with sutures intact.  Still some mild motion across the incisions are left the sutures intact.  There is no surrounding erythema, ascending cellulitis but there is no drainage or pus.  Minimal edema.  Minimal discomfort at surgical site.   Toes are rectus position. No other areas of tenderness to bilateral lower extremities.  No other open lesions or pre-ulcerative lesions.  No pain with calf compression, swelling, warmth, erythema.   Assessment and Plan:  Status post right foot surgery, doing well with no complications   -Treatment options discussed including all alternatives, risks, and complications -I with the sutures intact in the motion across the suture site.  Antibiotic ointment and bandage applied.  Keep the dressing clean, dry, intact. -Ice/elevation -Pain medication as needed. -Monitor for any clinical signs or symptoms of infection and DVT/PE and directed to call the office immediately should any occur or go to the ER. -Follow-up as scheduled for possible suture removal or sooner if any problems arise. In the meantime, encouraged to call the office with any questions, concerns, change in symptoms.   *X-ray/suture removal next appointment   Ovid Curd, DPM

## 2020-04-06 ENCOUNTER — Ambulatory Visit (INDEPENDENT_AMBULATORY_CARE_PROVIDER_SITE_OTHER): Payer: Medicaid Other

## 2020-04-06 ENCOUNTER — Other Ambulatory Visit: Payer: Self-pay

## 2020-04-06 ENCOUNTER — Ambulatory Visit (INDEPENDENT_AMBULATORY_CARE_PROVIDER_SITE_OTHER): Payer: Medicaid Other | Admitting: Podiatry

## 2020-04-06 DIAGNOSIS — M21619 Bunion of unspecified foot: Secondary | ICD-10-CM

## 2020-04-06 DIAGNOSIS — M21961 Unspecified acquired deformity of right lower leg: Secondary | ICD-10-CM

## 2020-04-06 DIAGNOSIS — M2041 Other hammer toe(s) (acquired), right foot: Secondary | ICD-10-CM

## 2020-04-07 NOTE — Progress Notes (Signed)
Subjective: Brittney Saunders is a 39 y.o. is seen today in office s/p RIGHT foot Lapidus bunionectomy and 2nd metatarsal osteotomy/hammertoe repair preformed on 03/15/2020.  She presents today for suture removal.  She is still been nonweightbearing using the cam boot.  She denies any recent injury or falls.  She has no concerns today. Denies any systemic complaints such as fevers, chills, nausea, vomiting. No calf pain, chest pain, shortness of breath.   Objective: General: No acute distress, AAOx3  DP/PT pulses palpable 2/4, CRT < 3 sec to all digits.  Protective sensation intact. Motor function intact.  Right foot: Incision is well coapted without any evidence of dehiscence with sutures intact.  Some mild edema present but overall swelling is improved.  There is no erythema, ascending cellulitis.  No drainage or pus or any signs of infection today.  Mild discomfort at surgical site but overall improving as well. Toes are rectus position. No other areas of tenderness to bilateral lower extremities.  No other open lesions or pre-ulcerative lesions.  No pain with calf compression, swelling, warmth, erythema.   Assessment and Plan:  Status post right foot surgery, doing well with no complications   -Treatment options discussed including all alternatives, risks, and complications -X-rays obtained reviewed.  Hardware intact but any complicating factors. -Remove the sutures today without any complications.  Antibiotic ointment was applied followed by a bandage.  She will start to wash the foot with soap and water pat dry thoroughly and apply a similar bandage. -Ice/elevation -Pain medication as needed-she has not been requiring this -Monitor for any clinical signs or symptoms of infection and DVT/PE and directed to call the office immediately should any occur or go to the ER. -Follow-up as scheduled for repeat x-rays and likely K wire removal or sooner if any problems arise. In the meantime,  encouraged to call the office with any questions, concerns, change in symptoms.   *X-ray/pin removal next appointment   Ovid Curd, DPM

## 2020-04-11 ENCOUNTER — Encounter: Payer: Medicaid Other | Admitting: Podiatry

## 2020-04-24 ENCOUNTER — Other Ambulatory Visit: Payer: Self-pay

## 2020-04-24 ENCOUNTER — Ambulatory Visit (INDEPENDENT_AMBULATORY_CARE_PROVIDER_SITE_OTHER): Payer: Medicaid Other | Admitting: Podiatry

## 2020-04-24 ENCOUNTER — Ambulatory Visit (INDEPENDENT_AMBULATORY_CARE_PROVIDER_SITE_OTHER): Payer: Medicaid Other

## 2020-04-24 DIAGNOSIS — M21961 Unspecified acquired deformity of right lower leg: Secondary | ICD-10-CM

## 2020-04-24 DIAGNOSIS — M21619 Bunion of unspecified foot: Secondary | ICD-10-CM

## 2020-04-24 DIAGNOSIS — M2041 Other hammer toe(s) (acquired), right foot: Secondary | ICD-10-CM

## 2020-04-27 ENCOUNTER — Encounter: Payer: Medicaid Other | Admitting: Podiatry

## 2020-04-27 NOTE — Progress Notes (Signed)
Subjective: Brittney Saunders is a 39 y.o. is seen today in office s/p RIGHT foot Lapidus bunionectomy and 2nd metatarsal osteotomy/hammertoe repair preformed on 03/15/2020.  Presents today for K wire removal of the second toe.  Otherwise she states that she been doing well in significant pain.  She is been nonweightbearing in the cam boot. Denies any systemic complaints such as fevers, chills, nausea, vomiting. No calf pain, chest pain, shortness of breath.   Objective: General: No acute distress, AAOx3  DP/PT pulses palpable 2/4, CRT < 3 sec to all digits.  Protective sensation intact. Motor function intact.  Right foot: Incision is well coapted without any evidence of dehiscence.  K wire intact the second toe without any drainage or pus.  There is still some mild edema present but overall swelling is improved.  There is no erythema, ascending cellulitis.  No drainage or pus or any signs of infection today.  Mild discomfort at surgical site but overall improving as well. Toes are rectus position. No other areas of tenderness to bilateral lower extremities.  No other open lesions or pre-ulcerative lesions.  No pain with calf compression, swelling, warmth, erythema.   Assessment and Plan:  Status post right foot surgery, doing well with no complications   -Treatment options discussed including all alternatives, risks, and complications -X-rays obtained reviewed.  Hardware intact to the first metatarsocuneiform joint line complicating factors.  K wire intact to the second toe. -There remove the K wire in total after I cleaned it with alcohol.  She tolerated well.  Antibiotic ointment is applied. -She can start to shower tomorrow morning for the hole is closing the K wire removal site. -Discussed transition to partial weightbearing in the cam boot.  Must wear the cam boot at all times. -Ice/elevation -Pain medication as needed-she has not been requiring this -Monitor for any clinical signs or  symptoms of infection and DVT/PE and directed to call the office immediately should any occur or go to the ER. -Follow-up as scheduled for repeat x-rays and likely K wire removal or sooner if any problems arise. In the meantime, encouraged to call the office with any questions, concerns, change in symptoms.   *X-ray/pin removal next appointment   Ovid Curd, DPM

## 2020-05-08 ENCOUNTER — Encounter: Payer: Medicaid Other | Admitting: Podiatry

## 2020-07-26 ENCOUNTER — Emergency Department: Payer: Medicaid Other

## 2020-07-26 ENCOUNTER — Other Ambulatory Visit: Payer: Self-pay

## 2020-07-26 ENCOUNTER — Emergency Department
Admission: EM | Admit: 2020-07-26 | Discharge: 2020-07-26 | Disposition: A | Discharge status: Home / Self Care | Payer: Medicaid Other | Attending: Emergency Medicine | Admitting: Emergency Medicine

## 2020-07-26 DIAGNOSIS — R079 Chest pain, unspecified: Secondary | ICD-10-CM

## 2020-07-26 DIAGNOSIS — Z20822 Contact with and (suspected) exposure to covid-19: Secondary | ICD-10-CM | POA: Insufficient documentation

## 2020-07-26 DIAGNOSIS — E039 Hypothyroidism, unspecified: Secondary | ICD-10-CM | POA: Diagnosis not present

## 2020-07-26 DIAGNOSIS — Z79899 Other long term (current) drug therapy: Secondary | ICD-10-CM | POA: Diagnosis not present

## 2020-07-26 LAB — CBC
HCT: 37.3 % (ref 36.0–46.0)
Hemoglobin: 11.8 g/dL — ABNORMAL LOW (ref 12.0–15.0)
MCH: 28.2 pg (ref 26.0–34.0)
MCHC: 31.6 g/dL (ref 30.0–36.0)
MCV: 89 fL (ref 80.0–100.0)
Platelets: 279 10*3/uL (ref 150–400)
RBC: 4.19 MIL/uL (ref 3.87–5.11)
RDW: 14.5 % (ref 11.5–15.5)
WBC: 7 10*3/uL (ref 4.0–10.5)
nRBC: 0 % (ref 0.0–0.2)

## 2020-07-26 LAB — BASIC METABOLIC PANEL
Anion gap: 7 (ref 5–15)
BUN: 14 mg/dL (ref 6–20)
CO2: 23 mmol/L (ref 22–32)
Calcium: 9 mg/dL (ref 8.9–10.3)
Chloride: 107 mmol/L (ref 98–111)
Creatinine, Ser: 0.9 mg/dL (ref 0.44–1.00)
GFR, Estimated: >60 mL/min (ref >60)
Glucose, Bld: 88 mg/dL (ref 70–99)
Potassium: 4.1 mmol/L (ref 3.5–5.1)
Sodium: 137 mmol/L (ref 135–145)

## 2020-07-26 LAB — RESP PANEL BY RT-PCR (FLU A&B, COVID) ARPGX2
Influenza A by PCR: NEGATIVE
Influenza B by PCR: NEGATIVE
SARS Coronavirus 2 by RT PCR: NEGATIVE

## 2020-07-26 LAB — POC URINE PREG, ED: Preg Test, Ur: NEGATIVE

## 2020-07-26 LAB — TROPONIN I (HIGH SENSITIVITY)
Troponin I (High Sensitivity): <2 ng/L (ref <18)
Troponin I (High Sensitivity): <2 ng/L (ref <18)

## 2020-07-26 LAB — D-DIMER, QUANTITATIVE: D-Dimer, Quant: 0.35 ug/mL-FEU (ref 0.00–0.50)

## 2020-07-26 MED ORDER — KETOROLAC TROMETHAMINE 30 MG/ML IJ SOLN
15.0000 mg | Freq: Once | INTRAMUSCULAR | Status: AC
  Administered 2020-07-26: 15 mg via INTRAVENOUS

## 2020-07-26 MED ORDER — KETOROLAC TROMETHAMINE 30 MG/ML IJ SOLN
INTRAMUSCULAR | Status: AC
  Filled 2020-07-26: qty 1

## 2020-07-26 MED ORDER — IOHEXOL 350 MG/ML SOLN
75.0000 mL | Freq: Once | INTRAVENOUS | Status: AC | PRN
  Administered 2020-07-26: 75 mL via INTRAVENOUS

## 2020-07-26 NOTE — ED Triage Notes (Signed)
Pt to ER via [facility] from [facility]. Pt reports indigestion that started last night. Pt reports feeling off all day. Approx 2hrs ago patient developed left sided chest pain that radiates into L arm, neck, and jaw. No associated shortness of breath.   [facility] administered 324 Asa @1810 , negative covid test.

## 2020-07-26 NOTE — ED Provider Notes (Signed)
-----------------------------------------   8:47 PM on 07/26/2020 -----------------------------------------  Blood pressure (!) 100/58, pulse 76, temperature 98.2 F (36.8 C), resp. rate 18, height 5\' 2"  (1.575 m), weight 60.8 kg, last menstrual period 07/05/2020, SpO2 100 %.  Assuming care from Dr. 09/04/2020.  In short, Brittney Saunders is a 40 y.o. female with a chief complaint of Chest Pain .  Refer to the original H&P for additional details.  The current plan of care is to follow-up CTA chest and repeat troponin.  ----------------------------------------- 10:39 PM on 07/26/2020 -----------------------------------------  CTA of chest is negative for PE or other acute process, repeat troponin is negative.  Given unremarkable work-up, patient is appropriate for discharge home with PCP follow-up.  She was counseled to return to the ED for any new or worsening symptoms, patient agrees with plan.     07/28/2020, MD 07/26/20 2239

## 2020-07-26 NOTE — ED Provider Notes (Signed)
[facility] Emergency Department Provider Note  Time seen: 8:41 PM  I have reviewed the triage vital signs and the nursing notes.   HISTORY  Chief Complaint Chest Pain   HPI Brittney Saunders is a 40 y.o. female with a past medical history of anemia, hyperlipidemia, presents to the emergency department  for acute onset of chest pain and hoarse voice.  According to the patient she had a headache this morning and had not been feeling well.  She states this afternoon around 4 5 PM she developed acute onset of left-sided chest pain and hoarse voice.  Patient denies coughing or sore throat.  No fever.  States she has never just lost her voice like this before.  Patient states mild chest pain mostly in the mid to left chest worse with deep inspiration.  No history of DVTs or blood clots.  No estrogen use.  Vital signs are reassuring.  Overall patient appears well.  Past Medical History:  Diagnosis Date  . Anemia   . Chest pain    a. ETT in 02/2016 showing no ischemic changes, echo with preserved EF of 60-65% with no WMA.  . Hyperlipidemia   . Hypothyroidism   . Low serum ferritin level 01/20/2017  . Lyme disease     Patient Active Problem List   Diagnosis Date Noted  . Excessive bleeding in premenopausal period 02/03/2017  . Low serum alkaline phosphatase 01/20/2017  . Low serum ferritin level 01/20/2017  . Acquired hypothyroidism 01/16/2017  . Fatigue 01/16/2017  . Bradycardia with 51-60 beats per minute 01/16/2017  . Acute bilateral low back pain without sciatica 01/16/2017    Past Surgical History:  Procedure Laterality Date  . AUGMENTATION MAMMAPLASTY Bilateral 2014   Saline, retropectoral  . CESAREAN SECTION  2005, 2006, 2008  . HERNIA REPAIR    . TUBAL LIGATION      Prior to Admission medications   Medication Sig Start Date End Date Taking? Authorizing Provider  cephALEXin (KEFLEX) 500 MG capsule Take 1 capsule (500 mg total) by mouth 3 (three)  times daily. 03/15/20   Vivi Barrack, DPM  escitalopram (LEXAPRO) 5 MG tablet Take 5 mg by mouth daily. 01/06/20   [provider]  levothyroxine (SYNTHROID, LEVOTHROID) 50 MCG tablet TAKE 1 TABLET BY MOUTH EVERY DAY **NEED OV WITH PCP & CARDIOLOGY** 08/12/17   Lennette Bihari, MD  meloxicam (MOBIC) 15 MG tablet TAKE 1 TABLET BY MOUTH EVERY DAY 02/12/17   Carlis Stable, PA-C  Methocarbamol (ROBAXIN PO) Take by mouth.    [provider]  oxyCODONE-acetaminophen (PERCOCET) 10-325 MG tablet Take 1 tablet by mouth every 4 (four) hours as needed for pain. MAXIMUM TOTAL ACETAMINOPHEN DOSE IS 4000 MG PER DAY 03/16/20   Vivi Barrack, DPM  oxyCODONE-acetaminophen (PERCOCET/ROXICET) 5-325 MG tablet Take 1-2 tablets by mouth every 6 (six) hours as needed for severe pain. 03/15/20   Vivi Barrack, DPM  promethazine (PHENERGAN) 25 MG tablet Take 1 tablet (25 mg total) by mouth every 8 (eight) hours as needed for nausea or vomiting. 03/15/20   Vivi Barrack, DPM  rizatriptan (MAXALT-MLT) 10 MG disintegrating tablet SMARTSIG:1 Tablet(s) By Mouth 1 to 2 Times Daily 01/04/20   [provider]  valACYclovir (VALTREX) 500 MG tablet Take 500 mg by mouth 2 (two) times daily. 01/11/20   [provider]  venlafaxine XR (EFFEXOR-XR) 75 MG 24 hr capsule Take 75 mg by mouth daily. 01/04/20   [provider]  Allergies  Allergen Reactions  . Prednisone Other (See Comments)    Doesn't help     Family History  Problem Relation Age of Onset  . Aneurysm Maternal Grandmother   . Cancer - Ovarian Maternal Grandmother   . Breast cancer Maternal Grandmother   . Congestive Heart Failure Maternal Grandfather   . Breast cancer Paternal Grandmother   . Heart attack Maternal Uncle 35  . Other Maternal Aunt        infection of the heart  . Breast cancer Maternal Aunt   . Breast cancer Maternal Aunt     Social History Social History   Tobacco Use   . Smoking status: Never Smoker  . Smokeless tobacco: Never Used  Substance Use Topics  . Alcohol use: Yes    Comment: occassional  . Drug use: No    Review of Systems Constitutional: Negative for fever. Cardiovascular: Central left-sided chest pain worse with deep inspiration. Respiratory: Negative for shortness of breath.  Negative for cough. Gastrointestinal: Negative for abdominal pain, vomiting Musculoskeletal: Negative for musculoskeletal complaints for leg pain or swelling. Skin: Negative for skin complaints  Neurological: Negative for headache All other ROS negative  ____________________________________________   PHYSICAL EXAM:  VITAL SIGNS: ED Triage Vitals  Enc Vitals Group     BP 07/26/20 1838 116/79     Pulse Rate 07/26/20 1838 76     Resp 07/26/20 1838 14     Temp 07/26/20 1838 98.2 F (36.8 C)     Temp src --      SpO2 07/26/20 1838 100 %     Weight 07/26/20 1836 134 lb (60.8 kg)     Height 07/26/20 1836 5\' 2"  (1.575 m)     Head Circumference --      Peak Flow --      Pain Score 07/26/20 1836 8     Pain Loc --      Pain Edu? --      Excl. in GC? --     Constitutional: Alert and oriented. Well appearing and in no distress. Eyes: Normal exam ENT      Head: Normocephalic and atraumatic.      Mouth/Throat: Mucous membranes are moist. Cardiovascular: Normal rate, regular rhythm.  Respiratory: Normal respiratory effort without tachypnea nor retractions. Breath sounds are clear Gastrointestinal: Soft and nontender. No distention.  Musculoskeletal: Nontender with normal range of motion in all extremities. No lower extremity tenderness or edema. Neurologic: Hoarse voice, normal language. No gross focal neurologic deficits  Skin:  Skin is warm, dry and intact.  Psychiatric: Mood and affect are normal.  ____________________________________________    EKG  EKG viewed and interpreted by myself shows normal sinus rhythm at 74 bpm with a narrow QRS, normal  axis, normal intervals, no concerning ST changes  ____________________________________________    RADIOLOGY  Chest x-ray is negative  ____________________________________________   INITIAL IMPRESSION / ASSESSMENT AND PLAN / ED COURSE  Pertinent labs & imaging results that were available during my care of the patient were reviewed by me and considered in my medical decision making (see chart for details).   Patient presents emergency department for central left-sided chest pain worse with deep inspiration.  Patient also describes acute onset of loss of voice, has a hoarse voice during exam.  EKG reassuring.  Chest x-ray reassuring.  Lab work is largely reassuring as well including a negative troponin.  We will repeat a troponin.  Given the patient's acute onset of loss of voice as  well as chest pain worse with deep inspiration we will obtain a CTA of the chest to rule out PE aortic aneurysm or dissection.  Patient agreeable to plan of care.  Patient care signed out to oncoming provider.  Kristiana Jacko was evaluated in Emergency Department on 07/26/2020 for the symptoms described in the history of present illness. She was evaluated in the context of the global COVID-19 pandemic, which necessitated consideration that the patient might be at risk for infection with the SARS-CoV-2 virus that causes COVID-19. Institutional protocols and algorithms that pertain to the evaluation of patients at risk for COVID-19 are in a state of rapid change based on information released by regulatory bodies including the CDC and federal and state organizations. These policies and algorithms were followed during the patient's care in the ED.  ____________________________________________   FINAL CLINICAL IMPRESSION(S) / ED DIAGNOSES  Chest pain   Minna Antis, MD 07/26/20 2045

## 2021-10-30 DIAGNOSIS — B360 Pityriasis versicolor: Secondary | ICD-10-CM | POA: Diagnosis not present

## 2021-10-30 DIAGNOSIS — N939 Abnormal uterine and vaginal bleeding, unspecified: Secondary | ICD-10-CM | POA: Diagnosis not present

## 2021-11-16 DIAGNOSIS — S90934A Unspecified superficial injury of right lesser toe(s), initial encounter: Secondary | ICD-10-CM | POA: Diagnosis not present

## 2021-11-21 DIAGNOSIS — D485 Neoplasm of uncertain behavior of skin: Secondary | ICD-10-CM | POA: Diagnosis not present

## 2021-11-21 DIAGNOSIS — L814 Other melanin hyperpigmentation: Secondary | ICD-10-CM | POA: Diagnosis not present

## 2021-11-21 DIAGNOSIS — L820 Inflamed seborrheic keratosis: Secondary | ICD-10-CM | POA: Diagnosis not present

## 2022-04-09 ENCOUNTER — Encounter: Payer: BC Managed Care – PPO | Admitting: Radiology

## 2022-04-11 ENCOUNTER — Encounter: Payer: Self-pay | Admitting: Radiology

## 2022-04-11 ENCOUNTER — Other Ambulatory Visit (HOSPITAL_COMMUNITY)
Admission: RE | Admit: 2022-04-11 | Discharge: 2022-04-11 | Disposition: A | Discharge status: Home / Self Care | Payer: BC Managed Care – PPO | Source: Ambulatory Visit | Attending: Radiology | Admitting: Radiology

## 2022-04-11 ENCOUNTER — Ambulatory Visit (INDEPENDENT_AMBULATORY_CARE_PROVIDER_SITE_OTHER): Payer: BC Managed Care – PPO | Admitting: Radiology

## 2022-04-11 VITALS — BP 116/74 | Ht 61.75 in | Wt 133.0 lb

## 2022-04-11 DIAGNOSIS — Z8639 Personal history of other endocrine, nutritional and metabolic disease: Secondary | ICD-10-CM

## 2022-04-11 DIAGNOSIS — N920 Excessive and frequent menstruation with regular cycle: Secondary | ICD-10-CM | POA: Diagnosis not present

## 2022-04-11 DIAGNOSIS — Z01419 Encounter for gynecological examination (general) (routine) without abnormal findings: Secondary | ICD-10-CM | POA: Diagnosis not present

## 2022-04-11 NOTE — Progress Notes (Signed)
Brittney Saunders [birth year] [id number]   History:  41 y.o. G3P3 here for annual exam as a new patient. Stopped levothyroxine 8 years ago, TSH was on the high side of normal however patient has become symptomatic again. Periods went from being irregular over the past few years to now having a period every 15 days.   Gynecologic History Patient's last menstrual period was 03/29/2022 (exact date). Period Duration (Days): 7 Period Pattern: (!) Irregular (irregular x's 2 years, every 2 weeks x's 4-5 months) Menstrual Flow: Heavy Menstrual Control: Tampon Dysmenorrhea: (!) Severe Dysmenorrhea Symptoms: Cramping Contraception/Family planning: tubal ligation Sexually active: yes Last Pap: 2018. Results were: normal Last mammogram: 2018. Results were: normal  Obstetric History OB History  Gravida Para Term Preterm AB Living  3 3 3     3   SAB IAB Ectopic Multiple Live Births               # Outcome Date GA Lbr Len/2nd Weight Sex Delivery Anes PTL Lv  3 Term           2 Term           1 Term              The following portions of the patient's history were reviewed and updated as appropriate: allergies, current medications, past family history, past medical history, past social history, past surgical history, and problem list.  Review of Systems A comprehensive review of systems was negative except for: Constitutional: positive for fatigue and night sweats Cardiovascular: positive for palpitations Neurological: positive for weakness Endocrine: positive for irregular menses   Past medical history, past surgical history, family history and social history were all reviewed and documented in the EPIC chart.   Exam:  Vitals:   04/11/22 1523  BP: 116/74  Weight: 133 lb (60.3 kg)  Height: 5' 1.75" (1.568 m)   Body mass index is 24.52 kg/m.  General appearance:  Normal Thyroid:  Symmetrical, normal in size, without palpable masses or nodularity. Respiratory  Auscultation:   Clear without wheezing or rhonchi Cardiovascular  Auscultation:  Regular rate, without rubs, murmurs or gallops  Edema/varicosities:  Not grossly evident Abdominal  Soft,nontender, without masses, guarding or rebound.  Liver/spleen:  No organomegaly noted  Hernia:  None appreciated  Skin  Inspection:  Grossly normal Breasts: Examined lying and sitting.   Right: Without masses, retractions, nipple discharge or axillary adenopathy.   Left: Without masses, retractions, nipple discharge or axillary adenopathy. Genitourinary   Inguinal/mons:  Normal without inguinal adenopathy  External genitalia:  Normal appearing vulva with no masses, tenderness, or lesions  BUS/Urethra/Skene's glands:  Normal without masses or exudate  Vagina:  Normal appearing with normal color and discharge, no lesions  Cervix:  Normal appearing without discharge or lesions  Uterus:  Normal in size, shape and contour.  Mobile, nontender  Adnexa/parametria:     Rt: Normal in size, without masses or tenderness.   Lt: Normal in size, without masses or tenderness.  Anus and perineum: Normal   Patient informed chaperone available to be present for breast and pelvic exam. Patient has requested no chaperone to be present. Patient has been advised what will be completed during breast and pelvic exam.   Assessment/Plan:   1. Well woman exam with routine gynecological exam - Cytology - PAP( [place]) - Schedule mammogram at [facility]  2. History of hypothyroidism - Thyroid Panel With TSH  3. Polymenorrhea - CBC   CD3 labs- future orders placed  Discussed SBE, colonoscopy and DEXA screening as directed/appropriate. Recommend of exercise weekly, including weight bearing exercise. Encouraged the use of seatbelts and sunscreen. Return in 1 year for annual or as needed.   Arlie Solomons B WHNP-BC 3:47 PM 04/11/2022

## 2022-04-12 LAB — THYROID PANEL WITH TSH
Free Thyroxine Index: 1.8 (ref 1.4–3.8)
T3 Uptake: 33 % (ref 22–35)
T4, Total: 5.4 ug/dL (ref 5.1–11.9)
TSH: 1.75 mIU/L

## 2022-04-12 LAB — CBC
HCT: 38.5 % (ref 35.0–45.0)
Hemoglobin: 13.2 g/dL (ref 11.7–15.5)
MCH: 32.2 pg (ref 27.0–33.0)
MCHC: 34.3 g/dL (ref 32.0–36.0)
MCV: 93.9 fL (ref 80.0–100.0)
MPV: 10.4 fL (ref 7.5–12.5)
Platelets: 272 10*3/uL (ref 140–400)
RBC: 4.1 10*6/uL (ref 3.80–5.10)
RDW: 12.2 % (ref 11.0–15.0)
WBC: 5.3 10*3/uL (ref 3.8–10.8)

## 2022-04-15 DIAGNOSIS — R3915 Urgency of urination: Secondary | ICD-10-CM

## 2022-04-15 NOTE — Telephone Encounter (Signed)
All labs were normal

## 2022-04-16 LAB — CYTOLOGY - PAP
Adequacy: ABSENT
Comment: NEGATIVE
Diagnosis: NEGATIVE
Diagnosis: REACTIVE
High risk HPV: NEGATIVE

## 2022-04-17 NOTE — Telephone Encounter (Signed)
Office visit or okay to come in for u/a only?

## 2022-04-17 NOTE — Telephone Encounter (Signed)
Ua only

## 2022-04-29 DIAGNOSIS — E7800 Pure hypercholesterolemia, unspecified: Secondary | ICD-10-CM | POA: Diagnosis not present

## 2022-04-29 DIAGNOSIS — E039 Hypothyroidism, unspecified: Secondary | ICD-10-CM | POA: Diagnosis not present

## 2022-04-29 DIAGNOSIS — X58XXXA Exposure to other specified factors, initial encounter: Secondary | ICD-10-CM | POA: Diagnosis not present

## 2022-04-29 DIAGNOSIS — S3141XA Laceration without foreign body of vagina and vulva, initial encounter: Secondary | ICD-10-CM | POA: Diagnosis not present

## 2022-05-03 DIAGNOSIS — J069 Acute upper respiratory infection, unspecified: Secondary | ICD-10-CM | POA: Diagnosis not present

## 2022-05-03 DIAGNOSIS — R051 Acute cough: Secondary | ICD-10-CM | POA: Diagnosis not present

## 2022-05-03 DIAGNOSIS — H6692 Otitis media, unspecified, left ear: Secondary | ICD-10-CM | POA: Diagnosis not present

## 2022-05-08 DIAGNOSIS — B360 Pityriasis versicolor: Secondary | ICD-10-CM | POA: Diagnosis not present

## 2022-05-08 DIAGNOSIS — H6692 Otitis media, unspecified, left ear: Secondary | ICD-10-CM | POA: Diagnosis not present

## 2022-05-08 DIAGNOSIS — R6889 Other general symptoms and signs: Secondary | ICD-10-CM | POA: Diagnosis not present

## 2022-05-08 DIAGNOSIS — J329 Chronic sinusitis, unspecified: Secondary | ICD-10-CM | POA: Diagnosis not present

## 2022-05-22 DIAGNOSIS — H6992 Unspecified Eustachian tube disorder, left ear: Secondary | ICD-10-CM | POA: Diagnosis not present

## 2022-05-22 DIAGNOSIS — H9192 Unspecified hearing loss, left ear: Secondary | ICD-10-CM | POA: Diagnosis not present

## 2022-05-22 DIAGNOSIS — Z1331 Encounter for screening for depression: Secondary | ICD-10-CM | POA: Diagnosis not present

## 2022-06-27 DIAGNOSIS — H9192 Unspecified hearing loss, left ear: Secondary | ICD-10-CM | POA: Diagnosis not present

## 2022-06-27 DIAGNOSIS — H61303 Acquired stenosis of external ear canal, unspecified, bilateral: Secondary | ICD-10-CM | POA: Diagnosis not present

## 2022-06-27 DIAGNOSIS — H903 Sensorineural hearing loss, bilateral: Secondary | ICD-10-CM | POA: Diagnosis not present

## 2022-07-13 DIAGNOSIS — U071 COVID-19: Secondary | ICD-10-CM | POA: Diagnosis not present

## 2022-07-13 DIAGNOSIS — R051 Acute cough: Secondary | ICD-10-CM | POA: Diagnosis not present

## 2022-08-20 ENCOUNTER — Ambulatory Visit (INDEPENDENT_AMBULATORY_CARE_PROVIDER_SITE_OTHER): Payer: BC Managed Care – PPO | Admitting: Radiology

## 2022-08-20 VITALS — BP 130/82 | Temp 98.5°F

## 2022-08-20 DIAGNOSIS — N898 Other specified noninflammatory disorders of vagina: Secondary | ICD-10-CM | POA: Diagnosis not present

## 2022-08-20 DIAGNOSIS — R102 Pelvic and perineal pain: Secondary | ICD-10-CM | POA: Diagnosis not present

## 2022-08-20 DIAGNOSIS — N760 Acute vaginitis: Secondary | ICD-10-CM | POA: Diagnosis not present

## 2022-08-20 DIAGNOSIS — N941 Unspecified dyspareunia: Secondary | ICD-10-CM | POA: Diagnosis not present

## 2022-08-20 DIAGNOSIS — N390 Urinary tract infection, site not specified: Secondary | ICD-10-CM | POA: Diagnosis not present

## 2022-08-20 DIAGNOSIS — M545 Low back pain, unspecified: Secondary | ICD-10-CM | POA: Diagnosis not present

## 2022-08-20 LAB — WET PREP FOR TRICH, YEAST, CLUE

## 2022-08-20 MED ORDER — METRONIDAZOLE 500 MG PO TABS: 500.0000 mg | ORAL_TABLET | Freq: Two times a day (BID) | ORAL | 0 refills | Status: DC

## 2022-08-20 NOTE — Progress Notes (Signed)
      Subjective: Brittney Saunders is a 42 y.o. female who c/opain during sex x's 3 weeks. Intermittent vaginal discharge, pink/red/brownish, pelvic cramping, lower back discomfort x's 3-4 days. Irregular periods x's 1 year, once a month x's 3 months but very heavy, passes clots. LMP: 08/06/22 very heavy, changed super tampons every 2 hours. BC: tubal      Review of Systems  All other systems reviewed and are negative.   Past Medical History:  Diagnosis Date   Anemia    Chest pain    a. ETT in 02/2016 showing no ischemic changes, echo with preserved EF of 60-65% with no WMA.   Hyperlipidemia    Hypothyroidism    Low serum ferritin level 01/20/2017   Lyme disease       Objective:  Today's Vitals   08/20/22 1438  BP: 130/82  Temp: 98.5 F (36.9 C)  TempSrc: Oral   There is no height or weight on file to calculate BMI.   -General: no acute distress -Vulva: without lesions or discharge -Vagina: discharge present, aptima swab and wet prep obtained -Cervix: no lesion or discharge, no CMT -Perineum: no lesions -Uterus: Mobile, non tender -Adnexa: no masses or tenderness   Microscopic wet-mount exam shows clue cells.   Raynelle Fanning, CMA present for exam  Assessment:/Plan:   1. Dyspareunia in female  - Urinalysis,Complete w/RFL Culture - Pregnancy, urine  2. Pelvic pain If pain continues after treatment will need f/u visit  3. Vaginal discharge - WET PREP FOR TRICH, YEAST, CLUE - SURESWAB CT/NG/T. vaginalis  4. BV (bacterial vaginosis) - metroNIDAZOLE (FLAGYL) 500 MG tablet; Take 1 tablet (500 mg total) by mouth 2 (two) times daily.  Dispense: 14 tablet; Refill: 0    Will contact patient with results of testing completed today. Avoid intercourse until symptoms are resolved. Safe sex encouraged. Avoid the use of soaps or perfumed products in the peri area. Avoid tub baths and sitting in sweaty or wet clothing for prolonged periods of time.

## 2022-08-21 LAB — SURESWAB CT/NG/T. VAGINALIS
C. trachomatis RNA, TMA: NOT DETECTED
N. gonorrhoeae RNA, TMA: NOT DETECTED
Trichomonas vaginalis RNA: NOT DETECTED

## 2022-08-23 ENCOUNTER — Other Ambulatory Visit: Payer: Self-pay

## 2022-08-23 DIAGNOSIS — N390 Urinary tract infection, site not specified: Secondary | ICD-10-CM

## 2022-08-23 LAB — URINALYSIS, COMPLETE W/RFL CULTURE
Bilirubin Urine: NEGATIVE
Glucose, UA: NEGATIVE
Hyaline Cast: NONE SEEN /LPF
Ketones, ur: NEGATIVE
Leukocyte Esterase: NEGATIVE
Nitrites, Initial: NEGATIVE
Protein, ur: NEGATIVE
Specific Gravity, Urine: 1.01 (ref 1.001–1.035)
pH: 7 (ref 5.0–8.0)

## 2022-08-23 LAB — URINE CULTURE
MICRO NUMBER:: 14831041
SPECIMEN QUALITY:: ADEQUATE

## 2022-08-23 LAB — PREGNANCY, URINE: Preg Test, Ur: NEGATIVE

## 2022-08-23 LAB — CULTURE INDICATED

## 2022-08-23 MED ORDER — NITROFURANTOIN MONOHYD MACRO 100 MG PO CAPS
100.0000 mg | ORAL_CAPSULE | Freq: Two times a day (BID) | ORAL | 0 refills | Status: AC
Start: 2022-08-23 — End: 2022-08-30

## 2022-09-18 DIAGNOSIS — K229 Disease of esophagus, unspecified: Secondary | ICD-10-CM | POA: Diagnosis not present

## 2022-10-15 DIAGNOSIS — R1310 Dysphagia, unspecified: Secondary | ICD-10-CM | POA: Diagnosis not present

## 2022-10-15 DIAGNOSIS — Z8379 Family history of other diseases of the digestive system: Secondary | ICD-10-CM | POA: Diagnosis not present

## 2022-10-15 DIAGNOSIS — K5904 Chronic idiopathic constipation: Secondary | ICD-10-CM | POA: Diagnosis not present

## 2022-10-23 DIAGNOSIS — K3189 Other diseases of stomach and duodenum: Secondary | ICD-10-CM | POA: Diagnosis not present

## 2022-10-23 DIAGNOSIS — K222 Esophageal obstruction: Secondary | ICD-10-CM | POA: Diagnosis not present

## 2022-10-23 DIAGNOSIS — K2960 Other gastritis without bleeding: Secondary | ICD-10-CM | POA: Diagnosis not present

## 2022-10-23 DIAGNOSIS — K2289 Other specified disease of esophagus: Secondary | ICD-10-CM | POA: Diagnosis not present

## 2022-10-23 DIAGNOSIS — K319 Disease of stomach and duodenum, unspecified: Secondary | ICD-10-CM | POA: Diagnosis not present

## 2022-10-23 DIAGNOSIS — B3781 Candidal esophagitis: Secondary | ICD-10-CM | POA: Diagnosis not present

## 2022-10-23 DIAGNOSIS — R1310 Dysphagia, unspecified: Secondary | ICD-10-CM | POA: Diagnosis not present

## 2022-10-23 DIAGNOSIS — K2970 Gastritis, unspecified, without bleeding: Secondary | ICD-10-CM | POA: Diagnosis not present

## 2022-10-23 DIAGNOSIS — K2950 Unspecified chronic gastritis without bleeding: Secondary | ICD-10-CM | POA: Diagnosis not present

## 2022-11-12 DIAGNOSIS — R1310 Dysphagia, unspecified: Secondary | ICD-10-CM | POA: Diagnosis not present

## 2022-11-12 DIAGNOSIS — K2960 Other gastritis without bleeding: Secondary | ICD-10-CM | POA: Diagnosis not present

## 2022-11-12 DIAGNOSIS — K5904 Chronic idiopathic constipation: Secondary | ICD-10-CM | POA: Diagnosis not present

## 2022-12-02 DIAGNOSIS — B3781 Candidal esophagitis: Secondary | ICD-10-CM | POA: Diagnosis not present

## 2022-12-18 DIAGNOSIS — N390 Urinary tract infection, site not specified: Secondary | ICD-10-CM | POA: Diagnosis not present

## 2023-01-30 DIAGNOSIS — Z79899 Other long term (current) drug therapy: Secondary | ICD-10-CM | POA: Diagnosis not present

## 2023-01-30 DIAGNOSIS — N390 Urinary tract infection, site not specified: Secondary | ICD-10-CM | POA: Diagnosis not present

## 2023-01-30 DIAGNOSIS — N760 Acute vaginitis: Secondary | ICD-10-CM | POA: Diagnosis not present

## 2023-01-30 DIAGNOSIS — B9689 Other specified bacterial agents as the cause of diseases classified elsewhere: Secondary | ICD-10-CM | POA: Diagnosis not present

## 2023-01-30 DIAGNOSIS — N3946 Mixed incontinence: Secondary | ICD-10-CM | POA: Diagnosis not present

## 2023-02-12 DIAGNOSIS — H1031 Unspecified acute conjunctivitis, right eye: Secondary | ICD-10-CM | POA: Diagnosis not present

## 2023-03-20 DIAGNOSIS — N390 Urinary tract infection, site not specified: Secondary | ICD-10-CM | POA: Diagnosis not present

## 2023-03-20 DIAGNOSIS — N760 Acute vaginitis: Secondary | ICD-10-CM | POA: Diagnosis not present

## 2023-03-20 DIAGNOSIS — B9689 Other specified bacterial agents as the cause of diseases classified elsewhere: Secondary | ICD-10-CM | POA: Diagnosis not present

## 2023-03-20 DIAGNOSIS — N3946 Mixed incontinence: Secondary | ICD-10-CM | POA: Diagnosis not present

## 2023-05-06 DIAGNOSIS — J09X3 Influenza due to identified novel influenza A virus with gastrointestinal manifestations: Secondary | ICD-10-CM | POA: Diagnosis not present

## 2023-05-06 DIAGNOSIS — Z20822 Contact with and (suspected) exposure to covid-19: Secondary | ICD-10-CM | POA: Diagnosis not present

## 2023-05-12 DIAGNOSIS — J0100 Acute maxillary sinusitis, unspecified: Secondary | ICD-10-CM | POA: Diagnosis not present

## 2023-05-12 DIAGNOSIS — Z20822 Contact with and (suspected) exposure to covid-19: Secondary | ICD-10-CM | POA: Diagnosis not present

## 2023-05-12 DIAGNOSIS — Z3202 Encounter for pregnancy test, result negative: Secondary | ICD-10-CM | POA: Diagnosis not present

## 2023-05-22 DIAGNOSIS — H61303 Acquired stenosis of external ear canal, unspecified, bilateral: Secondary | ICD-10-CM | POA: Diagnosis not present

## 2023-05-22 DIAGNOSIS — H9192 Unspecified hearing loss, left ear: Secondary | ICD-10-CM | POA: Diagnosis not present

## 2023-06-10 DIAGNOSIS — R519 Headache, unspecified: Secondary | ICD-10-CM | POA: Diagnosis not present

## 2023-06-10 DIAGNOSIS — M9903 Segmental and somatic dysfunction of lumbar region: Secondary | ICD-10-CM | POA: Diagnosis not present

## 2023-06-10 DIAGNOSIS — M9901 Segmental and somatic dysfunction of cervical region: Secondary | ICD-10-CM | POA: Diagnosis not present

## 2023-06-10 DIAGNOSIS — M542 Cervicalgia: Secondary | ICD-10-CM | POA: Diagnosis not present

## 2023-06-19 DIAGNOSIS — Z8639 Personal history of other endocrine, nutritional and metabolic disease: Secondary | ICD-10-CM | POA: Diagnosis not present

## 2023-06-19 DIAGNOSIS — H9192 Unspecified hearing loss, left ear: Secondary | ICD-10-CM | POA: Diagnosis not present

## 2023-06-19 DIAGNOSIS — N926 Irregular menstruation, unspecified: Secondary | ICD-10-CM | POA: Diagnosis not present

## 2023-06-19 DIAGNOSIS — R030 Elevated blood-pressure reading, without diagnosis of hypertension: Secondary | ICD-10-CM | POA: Diagnosis not present

## 2023-06-19 DIAGNOSIS — Z1331 Encounter for screening for depression: Secondary | ICD-10-CM | POA: Diagnosis not present

## 2023-06-19 DIAGNOSIS — R748 Abnormal levels of other serum enzymes: Secondary | ICD-10-CM | POA: Diagnosis not present

## 2023-06-19 LAB — LAB REPORT - SCANNED: EGFR: 114

## 2023-06-30 ENCOUNTER — Encounter: Payer: Self-pay | Admitting: Obstetrics and Gynecology

## 2023-06-30 ENCOUNTER — Ambulatory Visit (INDEPENDENT_AMBULATORY_CARE_PROVIDER_SITE_OTHER): Payer: BC Managed Care – PPO | Admitting: Obstetrics and Gynecology

## 2023-06-30 VITALS — BP 122/78 | HR 86 | Temp 98.2°F | Ht 62.0 in | Wt 139.0 lb

## 2023-06-30 DIAGNOSIS — Z113 Encounter for screening for infections with a predominantly sexual mode of transmission: Secondary | ICD-10-CM | POA: Diagnosis not present

## 2023-06-30 DIAGNOSIS — B3731 Acute candidiasis of vulva and vagina: Secondary | ICD-10-CM

## 2023-06-30 DIAGNOSIS — N898 Other specified noninflammatory disorders of vagina: Secondary | ICD-10-CM

## 2023-06-30 DIAGNOSIS — B009 Herpesviral infection, unspecified: Secondary | ICD-10-CM | POA: Diagnosis not present

## 2023-06-30 DIAGNOSIS — N939 Abnormal uterine and vaginal bleeding, unspecified: Secondary | ICD-10-CM | POA: Insufficient documentation

## 2023-06-30 DIAGNOSIS — N945 Secondary dysmenorrhea: Secondary | ICD-10-CM | POA: Diagnosis not present

## 2023-06-30 DIAGNOSIS — E785 Hyperlipidemia, unspecified: Secondary | ICD-10-CM | POA: Diagnosis not present

## 2023-06-30 DIAGNOSIS — E039 Hypothyroidism, unspecified: Secondary | ICD-10-CM | POA: Diagnosis not present

## 2023-06-30 DIAGNOSIS — H40033 Anatomical narrow angle, bilateral: Secondary | ICD-10-CM | POA: Diagnosis not present

## 2023-06-30 DIAGNOSIS — H04123 Dry eye syndrome of bilateral lacrimal glands: Secondary | ICD-10-CM | POA: Diagnosis not present

## 2023-06-30 DIAGNOSIS — Z1331 Encounter for screening for depression: Secondary | ICD-10-CM | POA: Diagnosis not present

## 2023-06-30 DIAGNOSIS — R748 Abnormal levels of other serum enzymes: Secondary | ICD-10-CM | POA: Diagnosis not present

## 2023-06-30 DIAGNOSIS — Z01419 Encounter for gynecological examination (general) (routine) without abnormal findings: Secondary | ICD-10-CM | POA: Diagnosis not present

## 2023-06-30 MED ORDER — NAPROXEN 500 MG PO TABS: 500.0000 mg | ORAL_TABLET | Freq: Two times a day (BID) | ORAL | 3 refills | Status: DC

## 2023-06-30 NOTE — Assessment & Plan Note (Signed)
 Hx of BTL+CD RTO for [place] Start naproxen

## 2023-06-30 NOTE — Assessment & Plan Note (Signed)
 Will complete hormonal work-up, [place] and EMB if >43yo or risk factors for endometrial hyperplasia or malignancy. RTO for [place]. Will discuss further management at that time.

## 2023-06-30 NOTE — Assessment & Plan Note (Addendum)
 Cervical cancer screening performed according to ASCCP guidelines. Encouraged annual mammogram screening, will complete with next appt. Will follow-up with family regarding BRCA testing. Labs and immunizations with her primary Encouraged safe sexual practices as indicated Encouraged healthy lifestyle practices with diet and exercise For patients under 43yo, I recommend 1000mg  calcium daily and 600IU of vitamin D daily.

## 2023-06-30 NOTE — Patient Instructions (Signed)

## 2023-06-30 NOTE — Progress Notes (Signed)
 43 y.o. G40P3003 female s/p BTL, bilateral mammoplasty, family history of breast and ovarian cancer, tobacco use here for annual exam. Single. Administrator.  She lives in [place], [place].  Patient notes changes in her cycle over the past year.  She is now having severe cramping with her cycle, poorly controlled with Midol.  She was diagnosed with BV in April 2024 and was complaining of dyspareunia at that time.  The dyspareunia has since resolved. She also reports heart racing and is wondering if this is related to hormonal changes.  [facility] however they are not available for review.  She reports they were normal. Her cycles are still q. 15 to 30 days.  Most cycles are q. 30 days.  She continues to have intermenstrual bleeding (spotting).  She is unsure if her aunts have undergone BRCA testing. Request STD testing  Patient's last menstrual period was 06/19/2023 (approximate). Period Duration (Days): 3-4 Period Pattern: Regular Menstrual Flow: Heavy Menstrual Control: Tampon Dysmenorrhea: (!) Severe Dysmenorrhea Symptoms: Cramping  Abnormal bleeding: As noted above Pelvic discharge or pain: As noted above Breast mass, nipple discharge or skin changes : None Birth control: BTL Last PAP:     Component Value Date/Time   DIAGPAP  04/11/2022 1547    - Negative for Intraepithelial Lesions or Malignancy (NILM)   DIAGPAP - Benign reactive/reparative changes 04/11/2022 1547   DIAGPAP  06/28/2016 0000    NEGATIVE FOR INTRAEPITHELIAL LESIONS OR MALIGNANCY.   HPVHIGH Negative 04/11/2022 1547   ADEQPAP  04/11/2022 1547    Satisfactory for evaluation; transformation zone component ABSENT.   ADEQPAP  06/28/2016 0000    Satisfactory for evaluation  endocervical/transformation zone component PRESENT.   Last mammogram: 2018 BI-RADS 1, density B Last colonoscopy: Never Sexually active: Yes Exercising: Yes, cardio and strength Smoker: Yes, vapes  GYN  HISTORY: No significant history  OB History  Gravida Para Term Preterm AB Living  3 3 3   3   SAB IAB Ectopic Multiple Live Births          # Outcome Date GA Lbr Len/2nd Weight Sex Type Anes PTL Lv  3 Term           2 Term           1 Term             Past Medical History:  Diagnosis Date   Anemia    Chest pain    a. ETT in 02/2016 showing no ischemic changes, echo with preserved EF of 60-65% with no WMA.   Hyperlipidemia    Hypothyroidism    Low serum ferritin level 01/20/2017   Lyme disease     Past Surgical History:  Procedure Laterality Date   AUGMENTATION MAMMAPLASTY Bilateral 2014   Saline, retropectoral   BUNIONECTOMY Right    03/2020   CESAREAN SECTION  2005, 2006, 2008   HERNIA REPAIR     TUBAL LIGATION      No current outpatient medications on file prior to visit.   No current facility-administered medications on file prior to visit.    Social History   Socioeconomic History   Marital status: Single    Spouse name: Not on file   Number of children: Not on file   Years of education: Not on file   Highest education level: Not on file  Occupational History   Not on file  Tobacco Use   Smoking status: Every Day  Types: E-cigarettes   Smokeless tobacco: Never   Tobacco comments:    Vapes  Substance and Sexual Activity   Alcohol use: Yes    Comment: occassional   Drug use: No   Sexual activity: Yes    Partners: Male    Birth control/protection: Surgical    Comment: tubal, menarche 43yo, sexual debut 43yo  Other Topics Concern   Not on file  Social History Narrative   Not on file   Social Drivers of Health   Financial Resource Strain: Not on file  Food Insecurity: Not on file  Transportation Needs: Not on file  Physical Activity: Not on file  Stress: Not on file  Social Connections: Not on file  Intimate Partner Violence: Not on file    Family History  Problem Relation Age of Onset   Aneurysm Maternal Grandmother    Cancer -  Ovarian Maternal Grandmother    Breast cancer Maternal Grandmother    Congestive Heart Failure Maternal Grandfather    Breast cancer Paternal Grandmother    Heart attack Maternal Uncle 35   Other Maternal Aunt        infection of the heart   Breast cancer Maternal Aunt    Breast cancer Maternal Aunt     Allergies  Allergen Reactions   Prednisone Other (See Comments)    Doesn't help  Hx chronic lyme's disease      PE Today's Vitals   06/30/23 1458  BP: 122/78  Pulse: 86  Temp: 98.2 F (36.8 C)  TempSrc: Oral  SpO2: 98%  Weight: 139 lb (63 kg)  Height: 5\' 2"  (1.575 m)   Body mass index is 25.42 kg/m.  Physical Exam Vitals reviewed. Exam conducted with a chaperone present.  Constitutional:      General: She is not in acute distress.    Appearance: Normal appearance.  HENT:     Head: Normocephalic and atraumatic.     Nose: Nose normal.  Eyes:     Extraocular Movements: Extraocular movements intact.     Conjunctiva/sclera: Conjunctivae normal.  Neck:     Thyroid: No thyroid mass, thyromegaly or thyroid tenderness.  Pulmonary:     Effort: Pulmonary effort is normal.  Chest:     Chest wall: No mass or tenderness.  Breasts:    Right: Normal. No swelling, mass, nipple discharge, skin change or tenderness.     Left: Normal. No swelling, mass, nipple discharge, skin change or tenderness.  Abdominal:     General: There is no distension.     Palpations: Abdomen is soft.     Tenderness: There is no abdominal tenderness.  Genitourinary:    General: Normal vulva.     Exam position: Lithotomy position.     Urethra: No prolapse.     Vagina: Normal. No vaginal discharge or bleeding.     Cervix: Normal. No lesion.     Uterus: Normal. Not enlarged and not tender.      Adnexa: Right adnexa normal and left adnexa normal.  Musculoskeletal:        General: Normal range of motion.     Cervical back: Normal range of motion.  Lymphadenopathy:     Upper Body:     Right upper  body: No axillary adenopathy.     Left upper body: No axillary adenopathy.     Lower Body: No right inguinal adenopathy. No left inguinal adenopathy.  Skin:    General: Skin is warm and dry.  Neurological:  General: No focal deficit present.     Mental Status: She is alert.  Psychiatric:        Mood and Affect: Mood normal.        Behavior: Behavior normal.       Assessment and Plan:        Well woman exam with routine gynecological exam Assessment & Plan: Cervical cancer screening performed according to ASCCP guidelines. Encouraged annual mammogram screening, will complete with next appt. Will follow-up with family regarding BRCA testing. Labs and immunizations with her primary Encouraged safe sexual practices as indicated Encouraged healthy lifestyle practices with diet and exercise For patients under 50yo, I recommend 1000mg  calcium daily and 600IU of vitamin D daily.    Screen for STD (sexually transmitted disease) -     Hepatitis B surface antigen -     Hepatitis C antibody -     RPR -     HIV Antibody (routine testing w rflx) -     HSV(herpes simplex vrs) 1+2 ab-IgG  Abnormal uterine bleeding (AUB) Assessment & Plan: Will complete hormonal work-up, [place] and EMB if >45yo or risk factors for endometrial hyperplasia or malignancy. RTO for [place]. Will discuss further management at that time.   Orders: -     Thyroid Panel With TSH -     FSH/LH -     Prolactin -     Testos,Total,Free and SHBG (Female) -     CBC -     hCG, quantitative, pregnancy  Secondary dysmenorrhea Assessment & Plan: Hx of BTL+CD RTO for [place] Start naproxen  Orders: -     US PELVIS TRANSVAGINAL NON-OB (TV ONLY); Future -     Naproxen; Take 1 tablet (500 mg total) by mouth 2 (two) times daily with a meal. Start 2 days before your cycle. Take up to 5d per cycle.  Dispense: 30 tablet; Refill: 3  Vaginal discharge -     SureSwab Advanced Vaginitis Plus,TMA   Rosalyn Gess, MD

## 2023-07-01 ENCOUNTER — Encounter: Payer: Self-pay | Admitting: Obstetrics and Gynecology

## 2023-07-01 LAB — HSV(HERPES SIMPLEX VRS) I + II AB-IGG
HSV 1 IGG,TYPE SPECIFIC AB: 7.27 {index} — ABNORMAL HIGH
HSV 2 IGG,TYPE SPECIFIC AB: 2.76 {index} — ABNORMAL HIGH

## 2023-07-01 LAB — SURESWAB® ADVANCED VAGINITIS PLUS,TMA
C. trachomatis RNA, TMA: NOT DETECTED
CANDIDA SPECIES: DETECTED — AB
Candida glabrata: NOT DETECTED
N. gonorrhoeae RNA, TMA: NOT DETECTED
SURESWAB(R) ADV BACTERIAL VAGINOSIS(BV),TMA: NEGATIVE
TRICHOMONAS VAGINALIS (TV),TMA: NOT DETECTED

## 2023-07-02 DIAGNOSIS — B009 Herpesviral infection, unspecified: Secondary | ICD-10-CM | POA: Insufficient documentation

## 2023-07-02 MED ORDER — FLUCONAZOLE 150 MG PO TABS: 150.0000 mg | ORAL_TABLET | Freq: Once | ORAL | 1 refills | Status: AC

## 2023-07-02 NOTE — Addendum Note (Signed)
 Addended by: Rosalyn Gess on: 07/02/2023 02:23 PM   Modules accepted: Orders

## 2023-07-04 LAB — HIV ANTIBODY (ROUTINE TESTING W REFLEX): HIV 1&2 Ab, 4th Generation: NONREACTIVE

## 2023-07-04 LAB — FSH/LH
FSH: 3.9 m[IU]/mL
LH: 4 m[IU]/mL

## 2023-07-04 LAB — CBC
HCT: 38.8 % (ref 35.0–45.0)
Hemoglobin: 12.8 g/dL (ref 11.7–15.5)
MCH: 31.2 pg (ref 27.0–33.0)
MCHC: 33 g/dL (ref 32.0–36.0)
MCV: 94.6 fL (ref 80.0–100.0)
MPV: 10.1 fL (ref 7.5–12.5)
Platelets: 349 10*3/uL (ref 140–400)
RBC: 4.1 10*6/uL (ref 3.80–5.10)
RDW: 12.3 % (ref 11.0–15.0)
WBC: 5.3 10*3/uL (ref 3.8–10.8)

## 2023-07-04 LAB — THYROID PANEL WITH TSH
Free Thyroxine Index: 1.4 (ref 1.4–3.8)
T3 Uptake: 35 % (ref 22–35)
T4, Total: 4.1 ug/dL — ABNORMAL LOW (ref 5.1–11.9)
TSH: 1.71 m[IU]/L

## 2023-07-04 LAB — TESTOS,TOTAL,FREE AND SHBG (FEMALE)
Free Testosterone: 2.1 pg/mL (ref 0.1–6.4)
Sex Hormone Binding: 69.7 nmol/L (ref 17–124)
Testosterone, Total, LC-MS-MS: 21 ng/dL (ref 2–45)

## 2023-07-04 LAB — HCG, QUANTITATIVE, PREGNANCY: HCG, Total, QN: <5 m[IU]/mL

## 2023-07-04 LAB — HEPATITIS C ANTIBODY: Hepatitis C Ab: NONREACTIVE

## 2023-07-04 LAB — PROLACTIN: Prolactin: 10.7 ng/mL

## 2023-07-04 LAB — HEPATITIS B SURFACE ANTIGEN: Hepatitis B Surface Ag: NONREACTIVE

## 2023-07-04 LAB — RPR: RPR Ser Ql: NONREACTIVE

## 2023-07-09 ENCOUNTER — Ambulatory Visit: Payer: BC Managed Care – PPO | Admitting: Obstetrics and Gynecology

## 2023-07-09 ENCOUNTER — Encounter: Payer: Self-pay | Admitting: Obstetrics and Gynecology

## 2023-07-09 VITALS — BP 104/72 | HR 78 | Temp 98.1°F

## 2023-07-09 DIAGNOSIS — N939 Abnormal uterine and vaginal bleeding, unspecified: Secondary | ICD-10-CM | POA: Diagnosis not present

## 2023-07-09 DIAGNOSIS — B009 Herpesviral infection, unspecified: Secondary | ICD-10-CM

## 2023-07-09 MED ORDER — VALACYCLOVIR HCL 500 MG PO TABS: 500.0000 mg | ORAL_TABLET | Freq: Every day | ORAL | 3 refills | Status: AC

## 2023-07-09 NOTE — Progress Notes (Signed)
 43 y.o. G83P3003 female s/p BTL, AUB, secondary dysmenorrhea (naproxen), family history of breast and ovarian cancer, bilateral mammoplasty,  tobacco use here for results discussion. 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  She lives in [place], [place].  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  She did discuss with her partner who has had cold sores in the past.  She has questions about transmission prevention and sexual relationships and with children around her.  At her 06/30/23 appt, she noted: "Patient notes changes in her cycle over the past year.  She is now having severe cramping with her cycle, poorly controlled with Midol.  She was diagnosed with BV in April 2024 and was complaining of dyspareunia at that time.  The dyspareunia has since resolved. She also reports heart racing and is wondering if this is related to hormonal changes.  [facility] however they are not available for review.  She reports they were normal. Her cycles are still q. 15 to 30 days.  Most cycles are q. 30 days.  She continues to have intermenstrual bleeding (spotting).  She is unsure if her aunts have undergone BRCA testing. Request STD testing"  Patient's last menstrual period was 06/19/2023 (approximate).    Birth control: BTL Last PAP:     Component Value Date/Time   DIAGPAP  04/11/2022 1547    - Negative for Intraepithelial Lesions or Malignancy (NILM)   DIAGPAP - Benign reactive/reparative changes 04/11/2022 1547   DIAGPAP  06/28/2016 0000    NEGATIVE FOR INTRAEPITHELIAL LESIONS OR MALIGNANCY.   HPVHIGH Negative 04/11/2022 1547   ADEQPAP  04/11/2022 1547    Satisfactory for evaluation; transformation zone component ABSENT.   ADEQPAP  06/28/2016 0000    Satisfactory for evaluation  endocervical/transformation zone component PRESENT.   Last mammogram: 2018 BI-RADS 1, density B  GYN HISTORY: No significant history  OB History  Gravida Para Term Preterm AB Living  3 3  3   3   SAB IAB Ectopic Multiple Live Births          # Outcome Date GA Lbr Len/2nd Weight Sex Type Anes PTL Lv  3 Term           2 Term           1 Term             Past Medical History:  Diagnosis Date   Anemia    Chest pain    a. ETT in 02/2016 showing no ischemic changes, echo with preserved EF of 60-65% with no WMA.   Hyperlipidemia    Hypothyroidism    Low serum ferritin level 01/20/2017   Lyme disease     Past Surgical History:  Procedure Laterality Date   AUGMENTATION MAMMAPLASTY Bilateral 2014   Saline, retropectoral   BUNIONECTOMY Right    03/2020   CESAREAN SECTION  2005, 2006, 2008   HERNIA REPAIR     TUBAL LIGATION      Current Outpatient Medications on File Prior to Visit  Medication Sig Dispense Refill   naproxen (NAPROSYN) 500 MG tablet Take 1 tablet (500 mg total) by mouth 2 (two) times daily with a meal. Start 2 days before your cycle. Take up to 5d per cycle. 30 tablet 3   No current facility-administered medications on file prior to visit.   Allergies  Allergen Reactions   Prednisone Other (See Comments)    Doesn't help  Hx chronic lyme's disease  PE Today's Vitals   07/09/23 0914  BP: 104/72  Pulse: 78  Temp: 98.1 F (36.7 C)  TempSrc: Oral  SpO2: 99%    There is no height or weight on file to calculate BMI.  Physical Exam Vitals reviewed.  Constitutional:      General: She is not in acute distress.    Appearance: Normal appearance.  HENT:     Head: Normocephalic and atraumatic.     Nose: Nose normal.  Eyes:     Extraocular Movements: Extraocular movements intact.     Conjunctiva/sclera: Conjunctivae normal.  Pulmonary:     Effort: Pulmonary effort is normal.  Musculoskeletal:        General: Normal range of motion.     Cervical back: Normal range of motion.  Neurological:     General: No focal deficit present.     Mental Status: She is alert.  Psychiatric:        Mood and Affect: Mood normal.        Behavior:  Behavior normal.       Assessment and Plan:        HSV (herpes simplex virus) infection Herpes education provided. Discussed viral condition that lead to mucocutaneous painful ulcerations.  Treatment is other expectant, episodic suppressive or continuous suppressive with antiviral therapy. However, there is no cure for HSV. Reviewed viral shedding is highest during outbreaks but can occur at any time. For patients with seronegative partners, I recommend condoms use and continuous suppression. However, no therapy is indicated if partner is positive. All questions answered. Patient desires suppression treatment.  -     valACYclovir HCl; Take 1 tablet (500 mg total) by mouth daily. With outbreaks, take 1 tablet by mouth twice a day for 3 days.  Dispense: 90 tablet; Refill: 3  Abnormal uterine bleeding (AUB) F/u at [place] appt  Rosalyn Gess, MD

## 2023-07-10 DIAGNOSIS — R109 Unspecified abdominal pain: Secondary | ICD-10-CM | POA: Diagnosis not present

## 2023-07-10 DIAGNOSIS — H9202 Otalgia, left ear: Secondary | ICD-10-CM | POA: Diagnosis not present

## 2023-07-10 DIAGNOSIS — H61303 Acquired stenosis of external ear canal, unspecified, bilateral: Secondary | ICD-10-CM | POA: Diagnosis not present

## 2023-07-10 DIAGNOSIS — N390 Urinary tract infection, site not specified: Secondary | ICD-10-CM | POA: Diagnosis not present

## 2023-07-10 DIAGNOSIS — H903 Sensorineural hearing loss, bilateral: Secondary | ICD-10-CM | POA: Diagnosis not present

## 2023-07-10 DIAGNOSIS — M545 Low back pain, unspecified: Secondary | ICD-10-CM | POA: Diagnosis not present

## 2023-07-21 DIAGNOSIS — Z83438 Family history of other disorder of lipoprotein metabolism and other lipidemia: Secondary | ICD-10-CM | POA: Diagnosis not present

## 2023-07-21 DIAGNOSIS — E559 Vitamin D deficiency, unspecified: Secondary | ICD-10-CM | POA: Diagnosis not present

## 2023-07-21 DIAGNOSIS — H905 Unspecified sensorineural hearing loss: Secondary | ICD-10-CM | POA: Diagnosis not present

## 2023-07-21 DIAGNOSIS — Z0000 Encounter for general adult medical examination without abnormal findings: Secondary | ICD-10-CM | POA: Diagnosis not present

## 2023-07-31 ENCOUNTER — Encounter: Payer: Self-pay | Admitting: Obstetrics and Gynecology

## 2023-07-31 ENCOUNTER — Ambulatory Visit: Payer: BC Managed Care – PPO | Admitting: Obstetrics and Gynecology

## 2023-07-31 ENCOUNTER — Ambulatory Visit (INDEPENDENT_AMBULATORY_CARE_PROVIDER_SITE_OTHER): Payer: BC Managed Care – PPO

## 2023-07-31 VITALS — BP 112/64 | HR 65 | Wt 135.0 lb

## 2023-07-31 DIAGNOSIS — N939 Abnormal uterine and vaginal bleeding, unspecified: Secondary | ICD-10-CM

## 2023-07-31 DIAGNOSIS — N945 Secondary dysmenorrhea: Secondary | ICD-10-CM | POA: Diagnosis not present

## 2023-07-31 NOTE — Progress Notes (Signed)
 43 y.o. G81P3003 female s/p BTL, AUB, secondary dysmenorrhea (naproxen), family history of breast and ovarian cancer, bilateral mammoplasty,  tobacco use, HSV positive here for TVUS. 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  At her 06/30/23 appt, she noted: "Patient notes changes in her cycle over the past year.  She is now having severe cramping with her cycle, poorly controlled with Midol.  She was diagnosed with BV in April 2024 and was complaining of dyspareunia at that time.  The dyspareunia has since resolved. She also reports heart racing and is wondering if this is related to hormonal changes.  [facility] labs however they are not available for review.  She reports they were normal. Her cycles are still q15 to 30 days.  Most cycles are q.30 days.  She continues to have intermenstrual bleeding (spotting).  She is unsure if her aunts have undergone BRCA testing. Request STD testing"  06/30/2023: CBC, PRL, FSH, LH, TSH, testosterone within normal limits Today she reports slight improvement in dysmenorrhea with naproxen.  Has cramping for 2 out of 4 days of her period, rated 8 out of 10.  The naproxen "takes the edge off ". Has had mood symptoms with contraception in the past and would like to avoid.   Patient's last menstrual period was 07/22/2023 (approximate). Period Duration (Days): 4 Period Pattern: Regular Menstrual Flow: Moderate Menstrual Control: Tampon Dysmenorrhea: (!) Severe  Birth control: BTL Last PAP:     Component Value Date/Time   DIAGPAP  04/11/2022 1547    - Negative for Intraepithelial Lesions or Malignancy (NILM)   DIAGPAP - Benign reactive/reparative changes 04/11/2022 1547   DIAGPAP  06/28/2016 0000    NEGATIVE FOR INTRAEPITHELIAL LESIONS OR MALIGNANCY.   HPVHIGH Negative 04/11/2022 1547   ADEQPAP  04/11/2022 1547    Satisfactory for evaluation; transformation zone component ABSENT.   ADEQPAP   06/28/2016 0000    Satisfactory for evaluation  endocervical/transformation zone component PRESENT.   Last mammogram: 2018 BI-RADS 1, density B  GYN HISTORY: No significant history  OB History  Gravida Para Term Preterm AB Living  3 3 3   3   SAB IAB Ectopic Multiple Live Births          # Outcome Date GA Lbr Len/2nd Weight Sex Type Anes PTL Lv  3 Term           2 Term           1 Term             Past Medical History:  Diagnosis Date   Anemia    Chest pain    a. ETT in 02/2016 showing no ischemic changes, echo with preserved EF of 60-65% with no WMA.   Hyperlipidemia    Hypothyroidism    Low serum ferritin level 01/20/2017   Lyme disease     Past Surgical History:  Procedure Laterality Date   AUGMENTATION MAMMAPLASTY Bilateral 2014   Saline, retropectoral   BUNIONECTOMY Right    03/2020   CESAREAN SECTION  2005, 2006, 2008   HERNIA REPAIR     TUBAL LIGATION      Current Outpatient Medications on File Prior to Visit  Medication Sig Dispense Refill   gentamicin (GARAMYCIN) 0.3 % ophthalmic solution SMARTSIG:2 Drop(s) In Eye(s) Every 3-4 Hours     naproxen (NAPROSYN) 500 MG tablet Take 1 tablet (500 mg total) by mouth 2 (two) times daily with  a meal. Start 2 days before your cycle. Take up to 5d per cycle. 30 tablet 3   valACYclovir (VALTREX) 500 MG tablet Take 1 tablet (500 mg total) by mouth daily. With outbreaks, take 1 tablet by mouth twice a day for 3 days. 90 tablet 3   No current facility-administered medications on file prior to visit.   Allergies  Allergen Reactions   Corticosteroids     Other Reaction(s): Unknown   Gluten Meal     Other Reaction(s): Unknown   Prednisone Other (See Comments)    Doesn't help  Hx chronic lyme's disease      PE Today's Vitals   07/31/23 0831  BP: 112/64  Pulse: 65  SpO2: 99%  Weight: 135 lb (61.2 kg)    Body mass index is 24.69 kg/m.  Physical Exam Vitals reviewed.  Constitutional:      General: She is  not in acute distress.    Appearance: Normal appearance.  HENT:     Head: Normocephalic and atraumatic.     Nose: Nose normal.  Eyes:     Extraocular Movements: Extraocular movements intact.     Conjunctiva/sclera: Conjunctivae normal.  Pulmonary:     Effort: Pulmonary effort is normal.  Musculoskeletal:        General: Normal range of motion.     Cervical back: Normal range of motion.  Neurological:     General: No focal deficit present.     Mental Status: She is alert.  Psychiatric:        Mood and Affect: Mood normal.        Behavior: Behavior normal.     07/31/23 TVUS: Indications: AUB, dysmenorrhea  Findings:   Uterus: 9.6 x 5.4 x 4.6 cm, anteverted uterus.  2.5 x 1.9 cm posterior intramural fibroid noted. Endometrial thickness: 13 mm. Left ovary: 3.4 x 2.1 x 2.1 cm, normal-appearing. Right ovary: 3.1 x 2.0 x 1.8 cm, normal-appearing. No free fluid.  Impression:  Small intramural fibroid noted.  Rosalyn Gess, MD   Assessment and Plan:        Abnormal uterine bleeding (AUB) Assessment & Plan: Normal hormonal workup, reviewed TVUS today revealing small intramural fibroid. First line therapies reviewed, including hormonal therapy and tranexamic acid. Also discussed endometrial ablation. Patient is interested in surgical management. Pamphlet provided for endometrial ablation. Will call when she has decided if she would like to proceed.    Secondary dysmenorrhea Assessment & Plan: Offer hormonal therapy however patient declines due to history of mood symptoms with birth control. Continue naproxen at this time.    Rosalyn Gess, MD    total time was spent for this patient encounter, including preparation, face-to-face counseling with the patient, coordination of care, and documentation of the encounter.

## 2023-07-31 NOTE — Assessment & Plan Note (Signed)
 Normal hormonal workup, reviewed TVUS today revealing small intramural fibroid. First line therapies reviewed, including hormonal therapy and tranexamic acid. Also discussed endometrial ablation. Patient is interested in surgical management. Pamphlet provided for endometrial ablation. Will call when she has decided if she would like to proceed.

## 2023-07-31 NOTE — Assessment & Plan Note (Signed)
 Offer hormonal therapy however patient declines due to history of mood symptoms with birth control. Continue naproxen at this time.

## 2023-09-01 ENCOUNTER — Ambulatory Visit (INDEPENDENT_AMBULATORY_CARE_PROVIDER_SITE_OTHER): Admitting: Obstetrics and Gynecology

## 2023-09-01 ENCOUNTER — Encounter: Payer: Self-pay | Admitting: Obstetrics and Gynecology

## 2023-09-01 ENCOUNTER — Other Ambulatory Visit (HOSPITAL_COMMUNITY)
Admission: RE | Admit: 2023-09-01 | Discharge: 2023-09-01 | Disposition: A | Discharge status: Home / Self Care | Source: Ambulatory Visit | Attending: Obstetrics and Gynecology | Admitting: Obstetrics and Gynecology

## 2023-09-01 VITALS — BP 100/62 | HR 64 | Resp 16 | Ht 62.0 in

## 2023-09-01 DIAGNOSIS — N939 Abnormal uterine and vaginal bleeding, unspecified: Secondary | ICD-10-CM | POA: Diagnosis not present

## 2023-09-01 NOTE — Progress Notes (Signed)
 43 y.o. G22P3003 female s/p BTL, AUB-irregular, secondary dysmenorrhea (naproxen ), family history of breast and ovarian cancer, bilateral mammoplasty,  tobacco use, HSV positive here for surgical consultation. 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  She lives in [place] . Patient's last menstrual period was 08/10/2023.    At her 06/30/23 appt, she noted: "Patient notes changes in her cycle over the past year.  She is now having severe cramping with her cycle, poorly controlled with Midol .  She was diagnosed with BV in April 2024 and was complaining of dyspareunia at that time.  The dyspareunia has since resolved. She also reports heart racing and is wondering if this is related to hormonal changes.  [facility] however they are not available for review.  She reports they were normal. Her cycles are still q15 to 30 days.  Most cycles are q.30 days.  She continues to have intermenstrual bleeding (spotting).  She is unsure if her aunts have undergone BRCA testing. Request STD testing"  06/30/2023: CBC, PRL, FSH, LH, TSH, testosterone within normal limits Today she reports slight improvement in dysmenorrhea with naproxen .  Has cramping for 2 out of 4 days of her period, rated 8 out of 10.  The naproxen  "takes the edge off ". Has had mood symptoms with contraception in the past and would like to avoid.   09/01/23 TVUS: Small intramural fibroid noted, 2.5cm  Most recent period earlier and lasted 11d. Interested in proceeding with endometrial ablation. No CP or SOB.  Birth control: BTL Last PAP:     Component Value Date/Time   DIAGPAP  04/11/2022 1547    - Negative for Intraepithelial Lesions or Malignancy (NILM)   DIAGPAP - Benign reactive/reparative changes 04/11/2022 1547   DIAGPAP  06/28/2016 0000    NEGATIVE FOR INTRAEPITHELIAL LESIONS OR MALIGNANCY.   HPVHIGH Negative 04/11/2022 1547   ADEQPAP  04/11/2022 1547    Satisfactory for evaluation;  transformation zone component ABSENT.   ADEQPAP  06/28/2016 0000    Satisfactory for evaluation  endocervical/transformation zone component PRESENT.   Last mammogram: 2018 BI-RADS 1, density B  GYN HISTORY: No significant history  OB History  Gravida Para Term Preterm AB Living  3 3 3   3   SAB IAB Ectopic Multiple Live Births          # Outcome Date GA Lbr Len/2nd Weight Sex Type Anes PTL Lv  3 Term           2 Term           1 Term             Past Medical History:  Diagnosis Date   Anemia    Chest pain    a. ETT in 02/2016 showing no ischemic changes, echo with preserved EF of 60-65% with no WMA.   Hyperlipidemia    Hypothyroidism    Low serum ferritin level 01/20/2017   Lyme disease     Past Surgical History:  Procedure Laterality Date   AUGMENTATION MAMMAPLASTY Bilateral 2014   Saline, retropectoral   BUNIONECTOMY Right    03/2020   CESAREAN SECTION  2005, 2006, 2008   HERNIA REPAIR     TUBAL LIGATION      Current Outpatient Medications on File Prior to Visit  Medication Sig Dispense Refill   gentamicin (GARAMYCIN) 0.3 % ophthalmic solution SMARTSIG:2 Drop(s) In Eye(s) Every 3-4 Hours     naproxen  (NAPROSYN ) 500 MG tablet Take 1  tablet (500 mg total) by mouth 2 (two) times daily with a meal. Start 2 days before your cycle. Take up to 5d per cycle. 30 tablet 3   valACYclovir  (VALTREX ) 500 MG tablet Take 1 tablet (500 mg total) by mouth daily. With outbreaks, take 1 tablet by mouth twice a day for 3 days. 90 tablet 3   No current facility-administered medications on file prior to visit.   Allergies  Allergen Reactions   Corticosteroids     Other Reaction(s): Unknown   Gluten Meal     Other Reaction(s): Unknown   Prednisone Other (See Comments)    Doesn't help  Hx chronic lyme's disease      PE Today's Vitals   09/01/23 1207  BP: 100/62  Pulse: 64  Resp: 16  Height: 5\' 2"  (1.575 m)    Body mass index is 24.69 kg/m.  Physical Exam Vitals  reviewed. Exam conducted with a chaperone present.  Constitutional:      General: She is not in acute distress.    Appearance: Normal appearance.  HENT:     Head: Normocephalic and atraumatic.     Nose: Nose normal.  Eyes:     Extraocular Movements: Extraocular movements intact.     Conjunctiva/sclera: Conjunctivae normal.  Cardiovascular:     Rate and Rhythm: Normal rate and regular rhythm.     Heart sounds: No murmur heard.    No friction rub. No gallop.  Pulmonary:     Effort: Pulmonary effort is normal. No respiratory distress.     Breath sounds: Normal breath sounds. No stridor. No wheezing, rhonchi or rales.  Genitourinary:    General: Normal vulva.     Exam position: Lithotomy position.     Vagina: Normal. No vaginal discharge.     Cervix: Normal. No cervical motion tenderness, discharge or lesion.     Uterus: Normal. Not enlarged and not tender.      Adnexa: Right adnexa normal and left adnexa normal.  Musculoskeletal:        General: Normal range of motion.     Cervical back: Normal range of motion.  Neurological:     General: No focal deficit present.     Mental Status: She is alert.  Psychiatric:        Mood and Affect: Mood normal.        Behavior: Behavior normal.     Procedure Consent was signed. Timeout was performed. Speculum inserted into the vagina, cervix visualized and was prepped with Betadine.  Cervical block was declined. A single-toothed tenaculum was placed on the anterior lip of the cervix to stabilize it.  The 3 mm pipelle was introduced into the endometrial cavity without difficulty to a depth of 9cm, suction initiated and a moderate amount of tissue was obtained and sent to pathology.  The instruments were removed from the patient's vagina.  Minimal bleeding from the cervix was noted.  The patient tolerated the procedure well.     Assessment and Plan:        Abnormal uterine bleeding (AUB) Assessment & Plan: Normal hormonal  workup 4/58/25 TVUS with small intramural fibroid. Discussed endometrial ablation vs hysterectomy.  Aware that endometrial ablation does not treat dysmenorrhea or pelvic pain and hysterectomy may be a better surgical management for AUB and dysmenorrhea.  However patient's primary concerning symptom is the abnormal uterine bleeding at this time.  She would like to proceed in a stepwise fashion.  Plan for hysteroscopy, dilation and curettage, NovaSure endometrial  ablation.  Aware that this does not provide contraception. Has BTL. Uncomplicated EM today.  Discussed outpatient procedure that provides management of abnormal uterine bleeding for approximately 5 years. Reviewed that  recovery is usually 1-2 days. Risks including infections, bleeding, and damage to surrounding organs reviewed. Recommend NPO prior to midnight and reviewed medication to take on day of surgery. Dicussed use of NSAIDS as needed for pain postoperatively.  Preop checklist: Antibiotics: none DVT ppx: SCDs Postop visit: 1 week Additional clearance: none Encouraged smoking cessation.  Orders: -     Surgical pathology -     Pregnancy, urine -     Ambulatory Referral For Surgery Scheduling    Romaine Closs, MD

## 2023-09-01 NOTE — Assessment & Plan Note (Addendum)
 Normal hormonal workup 4/58/25 TVUS with small intramural fibroid. Discussed endometrial ablation vs hysterectomy.  Aware that endometrial ablation does not treat dysmenorrhea or pelvic pain and hysterectomy may be a better surgical management for AUB and dysmenorrhea.  However patient's primary concerning symptom is the abnormal uterine bleeding at this time.  She would like to proceed in a stepwise fashion.  Plan for hysteroscopy, dilation and curettage, NovaSure endometrial ablation.  Aware that this does not provide contraception. Has BTL. Uncomplicated EM today.  Discussed outpatient procedure that provides management of abnormal uterine bleeding for approximately 5 years. Reviewed that  recovery is usually 1-2 days. Risks including infections, bleeding, and damage to surrounding organs reviewed. Recommend NPO prior to midnight and reviewed medication to take on day of surgery. Dicussed use of NSAIDS as needed for pain postoperatively.  Preop checklist: Antibiotics: none DVT ppx: SCDs Postop visit: 1 week Additional clearance: none Encouraged smoking cessation.

## 2023-09-02 LAB — PREGNANCY, URINE: Preg Test, Ur: NEGATIVE

## 2023-09-03 ENCOUNTER — Encounter: Payer: Self-pay | Admitting: Obstetrics and Gynecology

## 2023-09-03 LAB — SURGICAL PATHOLOGY

## 2023-09-24 ENCOUNTER — Encounter: Payer: Self-pay | Admitting: *Deleted

## 2023-09-30 ENCOUNTER — Encounter (HOSPITAL_COMMUNITY): Admission: RE | Payer: Self-pay | Source: Home / Self Care

## 2023-09-30 ENCOUNTER — Ambulatory Visit (HOSPITAL_COMMUNITY): Admission: RE | Admit: 2023-09-30 | Source: Home / Self Care | Admitting: Obstetrics and Gynecology

## 2023-09-30 SURGERY — ABLATION, ENDOMETRIUM, HYSTEROSCOPIC
Anesthesia: Monitor Anesthesia Care

## 2023-10-09 DIAGNOSIS — J309 Allergic rhinitis, unspecified: Secondary | ICD-10-CM | POA: Diagnosis not present

## 2023-10-09 DIAGNOSIS — J3089 Other allergic rhinitis: Secondary | ICD-10-CM | POA: Diagnosis not present

## 2023-12-03 ENCOUNTER — Ambulatory Visit: Admitting: Allergy and Immunology

## 2023-12-03 ENCOUNTER — Encounter: Payer: Self-pay | Admitting: Allergy and Immunology

## 2023-12-03 VITALS — BP 106/76 | HR 72 | Resp 16 | Ht 61.8 in | Wt 125.8 lb

## 2023-12-03 DIAGNOSIS — J3089 Other allergic rhinitis: Secondary | ICD-10-CM | POA: Diagnosis not present

## 2023-12-03 DIAGNOSIS — J301 Allergic rhinitis due to pollen: Secondary | ICD-10-CM

## 2023-12-03 DIAGNOSIS — H1013 Acute atopic conjunctivitis, bilateral: Secondary | ICD-10-CM

## 2023-12-03 DIAGNOSIS — H1010 Acute atopic conjunctivitis, unspecified eye: Secondary | ICD-10-CM

## 2023-12-03 MED ORDER — RYALTRIS 665-25 MCG/ACT NA SUSP: NASAL | 5 refills | Status: AC

## 2023-12-03 NOTE — Progress Notes (Unsigned)
 [place] [facility] [place]   Dear Rosaline Sinner,  Thank you for referring Brittney Saunders to the [facility] [place]  on 12/03/2023.   Below is a summation of this patient's evaluation and recommendations.  Thank you for your referral. I will keep you informed about this patient's response to treatment.   If you have any questions please do not hesitate to contact me.   Sincerely,  Camellia DOROTHA Denis, MD Allergy / Immunology [facility] [place]    ______________________________________________________________________    NEW PATIENT NOTE  Referring Provider: Sinner Rosaline GORMAN JULIANNA* Primary Provider: Orlando Dwayne NOVAK, FNP Date of office visit: 12/03/2023    Subjective:   Chief Complaint:  Brittney Saunders (DOB: November 05, 1980) is a 43 y.o. female who presents to the clinic on 12/03/2023 with a chief complaint of Nasal Congestion and Allergic Rhinitis  .     HPI: Brittney Saunders presents to this clinic in evaluation of allergies.  For the past several years she has developed a progressive problem with nasal congestion and sneezing and itchy watery eyes and itchiness of the skin around her head and she has these attacks where she has such intense symptoms that she cannot really function very well.    Her symptoms occur on a perennial basis but definitely flare during the spring and the summer especially following exposure to pollen and cat and dust.  She continues to have these symptoms even though she has been consistently using Flonase and Zyrtec daily.  Past Medical History:  Diagnosis Date   Anemia    Chest pain    a. ETT in 02/2016 showing no ischemic changes, echo with preserved EF of 60-65% with no WMA.   Hyperlipidemia    Hypothyroidism    Low serum ferritin level 01/20/2017   Lyme disease     Past Surgical History:  Procedure Laterality Date   AUGMENTATION  MAMMAPLASTY Bilateral 2014   Saline, retropectoral   BUNIONECTOMY Right    03/2020   CESAREAN SECTION  2005, 2006, 2008   HERNIA REPAIR     TUBAL LIGATION      Allergies as of 12/03/2023       Reactions   Corticosteroids    Other Reaction(s): Unknown   Gluten Meal    Other Reaction(s): Unknown   Prednisone Other (See Comments)   Doesn't help  Hx chronic lyme's disease        Medication List    fluticasone 50 MCG/ACT nasal spray Commonly known as: FLONASE Place into both nostrils daily as needed.   valACYclovir  500 MG tablet Commonly known as: VALTREX  Take 1 tablet (500 mg total) by mouth daily. With outbreaks, take 1 tablet by mouth twice a day for 3 days.   ZYRTEC-D PO Take by mouth daily.    Review of systems negative except as noted in HPI / PMHx or noted below:  Review of Systems  Constitutional: Negative.   HENT: Negative.    Eyes: Negative.   Respiratory: Negative.    Cardiovascular: Negative.   Gastrointestinal: Negative.   Genitourinary: Negative.   Musculoskeletal: Negative.   Skin: Negative.   Neurological: Negative.   Endo/Heme/Allergies: Negative.   Psychiatric/Behavioral: Negative.      Family History  Problem Relation Age of Onset   Aneurysm Maternal Grandmother    Cancer - Ovarian Maternal Grandmother    Breast cancer Maternal Grandmother    Congestive Heart Failure  Maternal Grandfather    Breast cancer Paternal Grandmother    Heart attack Maternal Uncle 35   Other Maternal Aunt        infection of the heart   Breast cancer Maternal Aunt    Breast cancer Maternal Aunt     Social History   Socioeconomic History   Marital status: Single    Spouse name: Not on file   Number of children: Not on file   Years of education: Not on file   Highest education level: Not on file  Occupational History   Not on file  Tobacco Use   Smoking status: Former    Types: E-cigarettes   Smokeless tobacco: Never   Tobacco comments:    Vapes   Substance and Sexual Activity   Alcohol use: Yes    Comment: occassional   Drug use: No   Sexual activity: Yes    Partners: Male    Birth control/protection: Surgical    Comment: tubal, menarche 43yo, sexual debut 43yo  Other Topics Concern   Not on file  Social History Narrative   Not on file    Objective:   Vitals:   12/03/23 0934  BP: 106/76  Pulse: 72  Resp: 16  SpO2: 98%   Height: 5' 1.8 (157 cm) Weight: 125 lb 12.8 oz (57.1 kg)  Physical Exam Constitutional:      Appearance: She is not diaphoretic.     Comments: Allergic shiners, sneezing  HENT:     Head: Normocephalic.     Right Ear: Tympanic membrane, ear canal and external ear normal.     Left Ear: Tympanic membrane, ear canal and external ear normal.     Nose: Mucosal edema present. No rhinorrhea.     Mouth/Throat:     Pharynx: Uvula midline. No oropharyngeal exudate.  Eyes:     Conjunctiva/sclera: Conjunctivae normal.  Neck:     Thyroid : No thyromegaly.     Trachea: Trachea normal. No tracheal tenderness or tracheal deviation.  Cardiovascular:     Rate and Rhythm: Normal rate and regular rhythm.     Heart sounds: Normal heart sounds, S1 normal and S2 normal. No murmur heard. Pulmonary:     Effort: No respiratory distress.     Breath sounds: Normal breath sounds. No stridor. No wheezing or rales.  Lymphadenopathy:     Head:     Right side of head: No tonsillar adenopathy.     Left side of head: No tonsillar adenopathy.     Cervical: No cervical adenopathy.  Skin:    Findings: No erythema or rash.     Nails: There is no clubbing.  Neurological:     Mental Status: She is alert.     Diagnostics: Allergy skin tests were not performed.   Assessment and Plan:    1. Perennial allergic rhinitis   2. Seasonal allergic rhinitis due to pollen   3. Seasonal allergic conjunctivitis    1. Return to clinic for skin testing without antihistamine use  2. Start prednisone 10 mg - 1 tablet 1 time per  day for 10 days only  3. Treat and prevent inflammation fo airway:   A. Ryaltris  - 2 sprays each nostril 1-2 times per day  4. If needed:   A. Cetirizine 10 mg - 1-2 tablets 1-2 times per day (MAX=4/day)  B. Nasal saline  C. Pataday - 1 drop each eye 1 time per day  5. Consider starting immunotherapy  6. Influenza = Tamiflu. Covid =  Paxlovid  Brittney Saunders and we will work through this issue by skin testing her to provide her allergen avoidance measures and also in anticipation of starting a course of immunotherapy.  She has basically failed medical therapy in the past.  We will give her a nasal steroid/antihistamine combination the use and I have given her a short course of steroids today to help with her spring/summer flareup and to assist with her discontinuation of antihistamines in anticipation of skin testing.SABRA Camellia DOROTHA Maurilio, MD Allergy / Immunology [facility] [place] 

## 2023-12-03 NOTE — Patient Instructions (Addendum)
  1. Return to clinic for skin testing without antihistamine use  2. Start prednisone 10 mg - 1 tablet 1 time per day for 10 days only  3. Treat and prevent inflammation fo airway:   A. Ryaltris  - 2 sprays each nostril 1-2 times per day  4. If needed:   A. Cetirizine 10 mg - 1-2 tablets 1-2 times per day (MAX=4/day)  B. Nasal saline  C. Pataday - 1 drop each eye 1 time per day  5. Consider starting immunotherapy  6. Influenza = Tamiflu. Covid = Paxlovid

## 2023-12-04 ENCOUNTER — Encounter: Payer: Self-pay | Admitting: Allergy and Immunology

## 2023-12-08 DIAGNOSIS — M79604 Pain in right leg: Secondary | ICD-10-CM | POA: Diagnosis not present

## 2023-12-11 ENCOUNTER — Ambulatory Visit: Admitting: Allergy and Immunology

## 2023-12-11 DIAGNOSIS — J3089 Other allergic rhinitis: Secondary | ICD-10-CM | POA: Diagnosis not present

## 2023-12-11 DIAGNOSIS — J301 Allergic rhinitis due to pollen: Secondary | ICD-10-CM

## 2023-12-15 NOTE — Progress Notes (Signed)
 Elesha returns to this clinic for skin testing.  Allergy skin testing identified hypersensitivity against trees, grasses, weeds, dust mites.  Allergen avoidance measures were provided.

## 2024-01-14 DIAGNOSIS — R202 Paresthesia of skin: Secondary | ICD-10-CM | POA: Diagnosis not present

## 2024-01-14 DIAGNOSIS — M542 Cervicalgia: Secondary | ICD-10-CM | POA: Diagnosis not present

## 2024-01-14 DIAGNOSIS — Z1231 Encounter for screening mammogram for malignant neoplasm of breast: Secondary | ICD-10-CM | POA: Diagnosis not present

## 2024-01-23 ENCOUNTER — Ambulatory Visit: Admitting: Neurology

## 2024-01-28 ENCOUNTER — Other Ambulatory Visit: Payer: Self-pay | Admitting: Allergy and Immunology

## 2024-01-28 DIAGNOSIS — J302 Other seasonal allergic rhinitis: Secondary | ICD-10-CM | POA: Diagnosis not present

## 2024-01-28 DIAGNOSIS — J301 Allergic rhinitis due to pollen: Secondary | ICD-10-CM | POA: Diagnosis not present

## 2024-01-28 DIAGNOSIS — J3089 Other allergic rhinitis: Secondary | ICD-10-CM

## 2024-01-28 NOTE — Progress Notes (Signed)
 Aeroallergen Immunotherapy  Ordering Provider: Dr. Camellia Denis  Patient Details Name: Brittney Saunders MRN: [id number] Date of Birth: 03/03/1981  Order one of two  Vial Label: dust mite, weed  0.3 ml (Volume)  1:20 Concentration -- Ragweed Mix 0.1 ml (Volume)  1:20 Concentration -- Cocklebur 0.1 ml (Volume)  1:10 Concentration -- Plantain English 0.1 ml (Volume)  1:40 Concentration -- Baccharis 0.5 ml (Volume)  1:20 Concentration -- Weed Mix* 0.5 ml (Volume)   AU Concentration -- Mite Mix (DF 5,000 & DP 5,000)   1.6  ml Extract Subtotal 3.4  ml Diluent  5.0  ml Maintenance Total  Blue Vial (1:100,000): Schedule B (6 doses) Yellow Vial (1:10,000): Schedule B (6 doses) Green Vial (1:1,000): Schedule B (6 doses) Red Vial (1:100): Schedule A (12 doses)  Special Instructions: 1-2 times per week

## 2024-01-28 NOTE — Progress Notes (Signed)
 Aeroallergen Immunotherapy   Ordering Provider: Dr. Camellia Denis   Patient Details  Name: Brittney Saunders  MRN: [id number]  Date of Birth: 11-20-1980   Order two of two   Vial Label: tree, grass   0.3 ml (Volume)  BAU Concentration -- 7 Grass Mix* 100,000 ([place]  Blue, Live Oak, [place], Perennial Rye, RedTop, Sweet Vernal, Timothy)  0.3 ml (Volume)  BAU Concentration -- [place] 10,000  0.2 ml (Volume)  1:20 Concentration -- [place]  0.2 ml (Volume)  1:20 Concentration -- Ash mix*  0.2 ml (Volume)  1:10 Concentration -- Valrie mix*    1.2  ml Extract Subtotal  3.8  ml Diluent   5.0  ml Maintenance Total   Blue Vial (1:100,000): Schedule B (6 doses)  Yellow Vial (1:10,000): Schedule B (6 doses)  Green Vial (1:1,000): Schedule B (6 doses)  Red Vial (1:100): Schedule A (12 doses)   Special Instructions: 1-2 times per week

## 2024-01-28 NOTE — Progress Notes (Signed)
 VIALS MADE 01-28-24

## 2024-02-16 ENCOUNTER — Ambulatory Visit (INDEPENDENT_AMBULATORY_CARE_PROVIDER_SITE_OTHER): Admitting: *Deleted

## 2024-02-16 DIAGNOSIS — J309 Allergic rhinitis, unspecified: Secondary | ICD-10-CM

## 2024-02-16 MED ORDER — EPINEPHRINE 0.3 MG/0.3ML IJ SOAJ: INTRAMUSCULAR | 3 refills | Status: AC

## 2024-02-16 NOTE — Progress Notes (Signed)
 Immunotherapy   Patient Details  Name: Brittney Saunders MRN: [id number] Date of Birth: 02/21/1981  02/16/2024  Brittney Saunders started injections for Brittney Saunders  Frequency:2 times per week Epi-Pen:Prescription for Epi-Pen given Consent signed and patient instructions given.   Brittney Saunders 02/16/2024, 8:21 AM

## 2024-02-23 ENCOUNTER — Ambulatory Visit (INDEPENDENT_AMBULATORY_CARE_PROVIDER_SITE_OTHER): Payer: Self-pay | Admitting: *Deleted

## 2024-02-23 DIAGNOSIS — J309 Allergic rhinitis, unspecified: Secondary | ICD-10-CM | POA: Diagnosis not present

## 2024-03-01 ENCOUNTER — Ambulatory Visit (INDEPENDENT_AMBULATORY_CARE_PROVIDER_SITE_OTHER): Payer: Self-pay | Admitting: *Deleted

## 2024-03-01 DIAGNOSIS — J309 Allergic rhinitis, unspecified: Secondary | ICD-10-CM | POA: Diagnosis not present

## 2024-03-08 ENCOUNTER — Ambulatory Visit: Payer: Self-pay | Admitting: *Deleted

## 2024-03-08 DIAGNOSIS — J309 Allergic rhinitis, unspecified: Secondary | ICD-10-CM | POA: Diagnosis not present

## 2024-03-17 ENCOUNTER — Ambulatory Visit (INDEPENDENT_AMBULATORY_CARE_PROVIDER_SITE_OTHER)

## 2024-03-17 DIAGNOSIS — J309 Allergic rhinitis, unspecified: Secondary | ICD-10-CM | POA: Diagnosis not present

## 2024-03-22 ENCOUNTER — Ambulatory Visit (INDEPENDENT_AMBULATORY_CARE_PROVIDER_SITE_OTHER): Payer: Self-pay | Admitting: *Deleted

## 2024-03-22 DIAGNOSIS — J309 Allergic rhinitis, unspecified: Secondary | ICD-10-CM | POA: Diagnosis not present

## 2024-04-05 ENCOUNTER — Emergency Department (HOSPITAL_COMMUNITY)

## 2024-04-05 ENCOUNTER — Encounter (HOSPITAL_COMMUNITY): Payer: Self-pay

## 2024-04-05 ENCOUNTER — Other Ambulatory Visit: Payer: Self-pay

## 2024-04-05 ENCOUNTER — Emergency Department (HOSPITAL_COMMUNITY)
Admission: EM | Admit: 2024-04-05 | Discharge: 2024-04-05 | Disposition: A | Discharge status: Home / Self Care | Attending: Emergency Medicine | Admitting: Emergency Medicine

## 2024-04-05 DIAGNOSIS — R1031 Right lower quadrant pain: Secondary | ICD-10-CM | POA: Diagnosis not present

## 2024-04-05 DIAGNOSIS — N3000 Acute cystitis without hematuria: Secondary | ICD-10-CM | POA: Diagnosis not present

## 2024-04-05 DIAGNOSIS — R109 Unspecified abdominal pain: Secondary | ICD-10-CM | POA: Diagnosis not present

## 2024-04-05 DIAGNOSIS — E039 Hypothyroidism, unspecified: Secondary | ICD-10-CM | POA: Diagnosis not present

## 2024-04-05 DIAGNOSIS — R188 Other ascites: Secondary | ICD-10-CM | POA: Diagnosis not present

## 2024-04-05 LAB — HCG, SERUM, QUALITATIVE: Preg, Serum: NEGATIVE

## 2024-04-05 LAB — COMPREHENSIVE METABOLIC PANEL WITH GFR
ALT: 20 U/L (ref 0–44)
AST: 21 U/L (ref 15–41)
Albumin: 3.5 g/dL (ref 3.5–5.0)
Alkaline Phosphatase: 31 U/L — ABNORMAL LOW (ref 38–126)
Anion gap: 11 (ref 5–15)
BUN: 9 mg/dL (ref 6–20)
CO2: 23 mmol/L (ref 22–32)
Calcium: 8.7 mg/dL — ABNORMAL LOW (ref 8.9–10.3)
Chloride: 105 mmol/L (ref 98–111)
Creatinine, Ser: 0.6 mg/dL (ref 0.44–1.00)
GFR, Estimated: >60 mL/min (ref >60)
Glucose, Bld: 97 mg/dL (ref 70–99)
Potassium: 4.4 mmol/L (ref 3.5–5.1)
Sodium: 139 mmol/L (ref 135–145)
Total Bilirubin: 0.5 mg/dL (ref 0.0–1.2)
Total Protein: 6.3 g/dL — ABNORMAL LOW (ref 6.5–8.1)

## 2024-04-05 LAB — URINALYSIS, ROUTINE W REFLEX MICROSCOPIC
Bilirubin Urine: NEGATIVE
Glucose, UA: NEGATIVE mg/dL
Hgb urine dipstick: NEGATIVE
Ketones, ur: NEGATIVE mg/dL
Nitrite: POSITIVE — AB
Protein, ur: NEGATIVE mg/dL
Specific Gravity, Urine: 1.015 (ref 1.005–1.030)
pH: 5.5 (ref 5.0–8.0)

## 2024-04-05 LAB — URINALYSIS, MICROSCOPIC (REFLEX): RBC / HPF: NONE SEEN RBC/hpf (ref 0–5)

## 2024-04-05 LAB — CBC
HCT: 39.8 % (ref 36.0–46.0)
Hemoglobin: 12.8 g/dL (ref 12.0–15.0)
MCH: 29.9 pg (ref 26.0–34.0)
MCHC: 32.2 g/dL (ref 30.0–36.0)
MCV: 93 fL (ref 80.0–100.0)
Platelets: 332 K/uL (ref 150–400)
RBC: 4.28 MIL/uL (ref 3.87–5.11)
RDW: 12.9 % (ref 11.5–15.5)
WBC: 4.7 K/uL (ref 4.0–10.5)
nRBC: 0 % (ref 0.0–0.2)

## 2024-04-05 LAB — LIPASE, BLOOD: Lipase: 24 U/L (ref 11–51)

## 2024-04-05 MED ORDER — IOHEXOL 350 MG/ML SOLN
75.0000 mL | Freq: Once | INTRAVENOUS | Status: AC | PRN
  Administered 2024-04-05: 75 mL via INTRAVENOUS

## 2024-04-05 MED ORDER — CEPHALEXIN 500 MG PO CAPS: 500.0000 mg | ORAL_CAPSULE | Freq: Four times a day (QID) | ORAL | 0 refills | Status: DC

## 2024-04-05 NOTE — ED Triage Notes (Signed)
 C/o right lower abd. Pain onset last pm denies n/v

## 2024-04-05 NOTE — ED Notes (Signed)
 Written and verbal discharge instructions administered. Pt denies any questions or concerns at this time. IV discontinued. Cath intact. 2x2 and tape applied to site. Bleeding controlled. Pt educated on appropriate hydration, peri care to prevent future uti, wearing cotton panties, and pain medications. Pt verbalized understanding.

## 2024-04-05 NOTE — ED Provider Notes (Signed)
  Physical Exam  BP 115/74   Pulse 93   Temp 98.4 F (36.9 C) (Oral)   Resp 18   Ht 5' 2 (1.575 m)   Wt 61.2 kg   LMP 03/15/2024 (Approximate)   SpO2 100%   BMI 24.69 kg/m   Physical Exam Vitals and nursing note reviewed.  Constitutional:      General: She is not in acute distress.    Appearance: She is well-developed.  HENT:     Head: Normocephalic and atraumatic.  Eyes:     Conjunctiva/sclera: Conjunctivae normal.  Cardiovascular:     Rate and Rhythm: Normal rate and regular rhythm.     Heart sounds: No murmur heard. Pulmonary:     Effort: Pulmonary effort is normal. No respiratory distress.     Breath sounds: Normal breath sounds.  Abdominal:     Palpations: Abdomen is soft.     Tenderness: There is no abdominal tenderness. There is no right CVA tenderness or left CVA tenderness. Negative signs include Murphy's sign, Rovsing's sign and McBurney's sign.  Musculoskeletal:        General: No swelling.     Cervical back: Neck supple.  Skin:    General: Skin is warm and dry.     Capillary Refill: Capillary refill takes less than 2 seconds.  Neurological:     Mental Status: She is alert.  Psychiatric:        Mood and Affect: Mood normal.     Procedures  Procedures  ED Course / MDM   Clinical Course as of 04/06/24 0014  Mon Apr 05, 2024  1539 S, RLQ pain, doesn't want pain meds, no N/V/D, if CT normal then can d/c. +UTI w/o urinary sx.  []  CT read []  if negative, d/c on keflex  [BS]    Clinical Course User Index [BS] Arlee Katz, MD   Medical Decision Making Amount and/or Complexity of Data Reviewed Labs: ordered. Radiology: ordered.  Risk Prescription drug management.   Workup overall reassuring, low suspicion for appendicitis, no evidence of nephrolithiasis versus, imaging.  Patient with some pain and burning with urination, will treat UTI with Keflex .  Endorses feels improved at this time, abdominal pain has significantly improved.  Overall,  will plan to start Keflex  for UTI, patient stable for discharge.  Discussed follow-up with PCP in 3 to 4 days for repeat evaluation       Arlee Katz, MD 04/06/24 9985    Tonia Chew, MD 04/07/24 1234

## 2024-04-05 NOTE — ED Provider Notes (Signed)
 [place] EMERGENCY DEPARTMENT AT [facility] Provider Note   CSN: [id number] Arrival date & time: 04/05/24  1000     Patient presents with: Abdominal Pain   Brittney Saunders is a 43 y.o. female.  {Add pertinent medical, surgical, social history, OB history to YEP:67052} Patient with history of hyperlipidemia presents today with complaints of abdominal pain. Reports that pain began yesterday evening and is located in her RLQ, does not radiate. Denies history of similar symptoms previously. Reports history of hernia repair, no other history of abdominal surgeries. No nausea, vomiting, or diarrhea. No fevers or chills. Denies dysuria or hematuria, no vaginal discharge or concern for STDs.   The history is provided by the patient. No language interpreter was used.  Abdominal Pain      Prior to Admission medications   Medication Sig Start Date End Date Taking? Authorizing Provider  Cetirizine-Pseudoephedrine (ZYRTEC-D PO) Take by mouth daily.    [provider]  EPINEPHrine  0.3 mg/0.3 mL IJ SOAJ injection Use as directed for life threatening allergic reactions 02/16/24   Saunders, Brittney J, MD  fluticasone (FLONASE) 50 MCG/ACT nasal spray Place into both nostrils daily as needed.    [provider]  RYALTRIS  [id number] MCG/ACT SUSP Use two sprays in each nostril one to two times daily as directed. 12/03/23   Saunders, Brittney PARAS, MD  valACYclovir  (VALTREX ) 500 MG tablet Take 1 tablet (500 mg total) by mouth daily. With outbreaks, take 1 tablet by mouth twice a day for 3 days. 07/09/23   Brittney Vera GAILS, MD    Allergies: Corticosteroids, Gluten meal, and Prednisone    Review of Systems  Gastrointestinal:  Positive for abdominal pain.  All other systems reviewed and are negative.   Updated Vital Signs BP 132/78   Pulse 87   Temp 98.2 F (36.8 C) (Oral)   Resp 19   Ht 5' 2 (1.575 m)   Wt 61.2 kg   LMP 03/15/2024 (Approximate)   SpO2 99%   BMI 24.69 kg/m    Physical Exam Vitals and nursing note reviewed.  Constitutional:      General: She is not in acute distress.    Appearance: Normal appearance. She is normal weight. She is not ill-appearing, toxic-appearing or diaphoretic.  HENT:     Head: Normocephalic and atraumatic.  Cardiovascular:     Rate and Rhythm: Normal rate.  Pulmonary:     Effort: Pulmonary effort is normal. No respiratory distress.  Abdominal:     General: Abdomen is flat.     Palpations: Abdomen is soft.     Tenderness: There is abdominal tenderness in the right lower quadrant.  Musculoskeletal:        General: Normal range of motion.     Cervical back: Normal range of motion.  Skin:    General: Skin is warm and dry.  Neurological:     General: No focal deficit present.     Mental Status: She is alert.  Psychiatric:        Mood and Affect: Mood normal.        Behavior: Behavior normal.     (all labs ordered are listed, but only abnormal results are displayed) Labs Reviewed  COMPREHENSIVE METABOLIC PANEL WITH GFR - Abnormal; Notable for the following components:      Result Value   Calcium  8.7 (*)    Total Protein 6.3 (*)    Alkaline Phosphatase 31 (*)    All other components within normal limits  URINALYSIS, ROUTINE W REFLEX MICROSCOPIC - Abnormal; Notable for the following components:   APPearance HAZY (*)    Nitrite POSITIVE (*)    Leukocytes,Ua SMALL (*)    All other components within normal limits  URINALYSIS, MICROSCOPIC (REFLEX) - Abnormal; Notable for the following components:   Bacteria, UA MANY (*)    All other components within normal limits  LIPASE, BLOOD  CBC  HCG, SERUM, QUALITATIVE    EKG: None  Radiology: No results found.  {Document cardiac monitor, telemetry assessment procedure when appropriate:32947} Procedures   Medications Ordered in the ED - No data to display  Clinical Course as of 04/05/24 1547  Mon Apr 05, 2024  1539 S, RLQ pain, doesn't want pain meds, no  N/V/D, if CT normal then can d/c. +UTI w/o urinary sx.  []  CT read []  if negative, d/c on keflex  [BS]    Clinical Course User Index [BS] Brittney Katz, MD   {Click here for ABCD2, HEART and other calculators REFRESH Note before signing:1}                              Medical Decision Making Amount and/or Complexity of Data Reviewed Labs: ordered. Radiology: ordered.  Risk Prescription drug management.   This patient is a 43 y.o. female who presents to the ED for concern of abdominal pain, this involves an extensive number of treatment options, and is a complaint that carries with it a high risk of complications and morbidity. The emergent differential diagnosis prior to evaluation includes, but is not limited to,  PID, appendicitis, kidney stone, septic abortion, ruptured ovarian cyst, ovarian torsion, tubo-ovarian abscess, fibroids, endometriosis, diverticulitis, cystitis, ectopic pregnancy, dysmenorrhea, septic abortion  This is not an exhaustive differential.   Past Medical History / Co-morbidities / Social History:  has a past medical history of Anemia, Chest pain, Hyperlipidemia, Hypothyroidism, Low serum ferritin level (01/20/2017), and Lyme disease.  Additional history: Chart reviewed.  Physical Exam: Physical exam performed. The pertinent findings include: RLQ TTP  Lab Tests: I ordered, and personally interpreted labs.  The pertinent results include:  UA with positive nitrite, leukocytes, many bacteria, no WBCs.    Imaging Studies: I ordered imaging studies including CT abdomen pelvis.   Results pending at shift change   Medications: Offered pain medication which patient declined   Disposition:  Patients CT is pending at shift change, will determine dispo. If negative, suspect d/c with Keflex  for UTI.   Care handoff to Saunders Arlee, MD at shift change. Please see their note for continued evaluation and disposition.   Final diagnoses:  None    ED  Discharge Orders     None

## 2024-04-05 NOTE — ED Notes (Signed)
 Pt in ct

## 2024-04-05 NOTE — Discharge Instructions (Addendum)
 Your symptoms may be secondary to UTI.  Imaging is overall reassuring. I would recommend starting on Keflex , which was prescribed today.  This to be able to help provide treatment for UTI. I would recommend taking Tylenol  and Motrin  to help with pain at home.

## 2024-04-07 ENCOUNTER — Ambulatory Visit

## 2024-04-07 DIAGNOSIS — J309 Allergic rhinitis, unspecified: Secondary | ICD-10-CM | POA: Diagnosis not present

## 2024-04-14 ENCOUNTER — Ambulatory Visit (INDEPENDENT_AMBULATORY_CARE_PROVIDER_SITE_OTHER)

## 2024-04-14 DIAGNOSIS — J309 Allergic rhinitis, unspecified: Secondary | ICD-10-CM

## 2024-04-20 ENCOUNTER — Ambulatory Visit

## 2024-04-20 DIAGNOSIS — J309 Allergic rhinitis, unspecified: Secondary | ICD-10-CM | POA: Diagnosis not present

## 2024-04-26 ENCOUNTER — Ambulatory Visit: Admitting: *Deleted

## 2024-04-26 DIAGNOSIS — J309 Allergic rhinitis, unspecified: Secondary | ICD-10-CM

## 2024-05-04 ENCOUNTER — Ambulatory Visit (INDEPENDENT_AMBULATORY_CARE_PROVIDER_SITE_OTHER)

## 2024-05-04 DIAGNOSIS — J309 Allergic rhinitis, unspecified: Secondary | ICD-10-CM | POA: Diagnosis not present

## 2024-05-10 ENCOUNTER — Encounter: Payer: Self-pay | Admitting: Allergy and Immunology

## 2024-05-10 ENCOUNTER — Ambulatory Visit: Admitting: Allergy and Immunology

## 2024-05-10 VITALS — BP 114/78 | HR 76 | Temp 98.6°F | Resp 16

## 2024-05-10 DIAGNOSIS — H1010 Acute atopic conjunctivitis, unspecified eye: Secondary | ICD-10-CM | POA: Diagnosis not present

## 2024-05-10 DIAGNOSIS — J301 Allergic rhinitis due to pollen: Secondary | ICD-10-CM | POA: Diagnosis not present

## 2024-05-10 DIAGNOSIS — J3089 Other allergic rhinitis: Secondary | ICD-10-CM | POA: Diagnosis not present

## 2024-05-10 DIAGNOSIS — J302 Other seasonal allergic rhinitis: Secondary | ICD-10-CM

## 2024-05-10 NOTE — Progress Notes (Signed)
 "  [place] - [place] - [place] - [place] - [place]   Follow-up Note  Referring Provider: Orlando Dwayne NOVAK, FNP Primary Provider: Orlando Dwayne NOVAK, FNP Date of Office Visit: 05/10/2024  Subjective:   Brittney Saunders (DOB: 1980/07/20) is a 44 y.o. female who [facility] in re-evaluation of the following:  HPI: [name] presents to this clinic in evaluation of allergies.  I last saw her in this clinic 03 December 2023.  She started a course of immunotherapy and overall has done very well and uses some Ryaltris  as needed.  She has not had any sinus infections and has not required a systemic steroid or antibiotic for any type of airway issue.  But this weekend she had to take care of a family dog and she had lots of problems with her eyes and nose during that exposure.  On previous skin testing she was not allergic to cat or dog.  Her immunotherapy extract does not include cat or dog.  Allergies as of 05/10/2024       Reactions   Corticosteroids Other (See Comments)    Unknown   Gluten Meal Other (See Comments)   Unknown   Prednisone Other (See Comments)   Doesn't help  Hx chronic lyme's disease        Medication List    EPINEPHrine  0.3 mg/0.3 mL Soaj injection Commonly known as: EPI-PEN Use as directed for life threatening allergic reactions   NON FORMULARY Inject 1 Dose into the skin once a week. Allergy  Shot   Ryaltris  [id number] MCG/ACT Susp Generic drug: Olopatadine-Mometasone Use two sprays in each nostril one to two times daily as directed.   valACYclovir  500 MG tablet Commonly known as: VALTREX  Take 1 tablet (500 mg total) by mouth daily. With outbreaks, take 1 tablet by mouth twice a day for 3 days.   ZYRTEC-D PO Take 1 tablet by mouth 2 (two) times daily as needed (allergies).    Past Medical History:  Diagnosis Date   Anemia    Chest pain    a. ETT in 02/2016 showing no ischemic changes, echo with preserved  EF of 60-65% with no WMA.   Hyperlipidemia    Hypothyroidism    Low serum ferritin level 01/20/2017   Lyme disease     Past Surgical History:  Procedure Laterality Date   AUGMENTATION MAMMAPLASTY Bilateral 2014   Saline, retropectoral   BUNIONECTOMY Right    03/2020   CESAREAN SECTION  2005, 2006, 2008   HERNIA REPAIR     TUBAL LIGATION      Review of systems negative except as noted in HPI / PMHx or noted below:  Review of Systems  Constitutional: Negative.   HENT: Negative.    Eyes: Negative.   Respiratory: Negative.    Cardiovascular: Negative.   Gastrointestinal: Negative.   Genitourinary: Negative.   Musculoskeletal: Negative.   Skin: Negative.   Neurological: Negative.   Endo/Heme/Allergies: Negative.   Psychiatric/Behavioral: Negative.       Objective:   Vitals:   05/10/24 0821  BP: 114/78  Pulse: 76  Resp: 16  Temp: 98.6 F (37 C)  SpO2: 98%          Physical Exam Constitutional:      Appearance: She is not diaphoretic.  HENT:     Head: Normocephalic.     Right Ear: Tympanic membrane, ear canal and external ear normal.     Left Ear: Tympanic membrane, ear  canal and external ear normal.     Nose: Nose normal. No mucosal edema or rhinorrhea.     Mouth/Throat:     Pharynx: Uvula midline. No oropharyngeal exudate.  Eyes:     Conjunctiva/sclera: Conjunctivae normal.  Neck:     Thyroid : No thyromegaly.     Trachea: Trachea normal. No tracheal tenderness or tracheal deviation.  Cardiovascular:     Rate and Rhythm: Normal rate and regular rhythm.     Heart sounds: Normal heart sounds, S1 normal and S2 normal. No murmur heard. Pulmonary:     Effort: No respiratory distress.     Breath sounds: Normal breath sounds. No stridor. No wheezing or rales.  Lymphadenopathy:     Head:     Right side of head: No tonsillar adenopathy.     Left side of head: No tonsillar adenopathy.     Cervical: No cervical adenopathy.  Skin:    Findings: No erythema or  rash.     Nails: There is no clubbing.  Neurological:     Mental Status: She is alert.     Diagnostics: none  Assessment and Plan:   1. Perennial allergic rhinitis   2. Seasonal allergic rhinitis due to pollen   3. Seasonal allergic conjunctivitis    1. Allergen avoidance measures - dust mite, pollens  2. Treat and prevent inflammation fo airway:   A. Ryaltris  - 2 sprays each nostril 1-2 times per day  B. Immunotherapy   3. If needed:   A. Cetirizine 10 mg - 1-2 tablets 1-2 times per day (MAX=4/day)  B. Nasal saline  C. Pataday - 1 drop each eye 1 time per day  4. Influenza = Tamiflu. Covid = Paxlovid  5. Check blood - Dog IgE, Cat IgE. Add cat a/o dog to immunotherapy???  6. Return to clinic in 1 year or earlier if problem  [name] apparently had some significant problems with her eyes and nose after exposure to dog and we will see if she is allergic to dog by checking IgE antibodies against animals including dog.  Currently there is no dog extract located within her immunotherapy but if she demonstrates antibodies against dog we will add that allergen.  She will continue on therapy noted above and if she does well we will see her back in this clinic in 1 year or earlier if there is a problem.  Camellia Denis, MD Allergy  / Immunology [facility] "

## 2024-05-10 NOTE — Telephone Encounter (Signed)
 Will call us  back if she has any more issues.

## 2024-05-10 NOTE — Telephone Encounter (Signed)
 Talked to patient over the phone. She will go ahead and take her allergy  medication and ice the area.

## 2024-05-10 NOTE — Patient Instructions (Addendum)
" °  1. Allergen avoidance measures - dust mite, pollens  2. Treat and prevent inflammation fo airway:   A. Ryaltris  - 2 sprays each nostril 1-2 times per day  B. Immunotherapy   3. If needed:   A. Cetirizine 10 mg - 1-2 tablets 1-2 times per day (MAX=4/day)  B. Nasal saline  C. Pataday - 1 drop each eye 1 time per day  4. Influenza = Tamiflu. Covid = Paxlovid  5. Check blood - Dog IgE, Cat IgE. Add cat a/o dog to immunotherapy???  6. Return to clinic in 1 year or earlier if problem "

## 2024-05-11 ENCOUNTER — Encounter: Payer: Self-pay | Admitting: Allergy and Immunology

## 2024-05-13 LAB — ALLERGEN, CAT DANDER, E1: Cat Dander IgE: <0.1 kU/L

## 2024-05-13 LAB — ALLERGEN, DOG DANDER, E5: Dog Dander IgE: 0.15 kU/L — AB

## 2024-05-17 ENCOUNTER — Ambulatory Visit: Admitting: *Deleted

## 2024-05-17 DIAGNOSIS — J302 Other seasonal allergic rhinitis: Secondary | ICD-10-CM

## 2024-05-19 ENCOUNTER — Ambulatory Visit: Payer: Self-pay | Admitting: Allergy and Immunology

## 2024-06-03 ENCOUNTER — Ambulatory Visit (INDEPENDENT_AMBULATORY_CARE_PROVIDER_SITE_OTHER): Admitting: *Deleted

## 2024-06-03 DIAGNOSIS — J302 Other seasonal allergic rhinitis: Secondary | ICD-10-CM | POA: Diagnosis not present

## 2024-06-08 ENCOUNTER — Encounter: Payer: Self-pay | Admitting: Nurse Practitioner

## 2024-06-08 DIAGNOSIS — Z1231 Encounter for screening mammogram for malignant neoplasm of breast: Secondary | ICD-10-CM
